# Patient Record
Sex: Male | Born: 1950 | Race: White | Hispanic: No | Marital: Married | Smoking: Never smoker
Health system: Southern US, Community
[De-identification: ages and names within clinical notes are randomized; demographics above are authoritative.]

## PROBLEM LIST (undated history)

## (undated) DIAGNOSIS — T300 Burn of unspecified body region, unspecified degree: Secondary | ICD-10-CM

## (undated) DIAGNOSIS — K759 Inflammatory liver disease, unspecified: Secondary | ICD-10-CM

## (undated) DIAGNOSIS — Y990 Civilian activity done for income or pay: Secondary | ICD-10-CM

## (undated) DIAGNOSIS — I8390 Asymptomatic varicose veins of unspecified lower extremity: Secondary | ICD-10-CM

## (undated) DIAGNOSIS — E785 Hyperlipidemia, unspecified: Secondary | ICD-10-CM

## (undated) DIAGNOSIS — E291 Testicular hypofunction: Secondary | ICD-10-CM

## (undated) DIAGNOSIS — E669 Obesity, unspecified: Secondary | ICD-10-CM

## (undated) HISTORY — PX: KNEE ARTHROSCOPY: SHX127

## (undated) HISTORY — DX: Burn of unspecified body region, unspecified degree: T30.0

## (undated) HISTORY — DX: Inflammatory liver disease, unspecified: K75.9

## (undated) HISTORY — DX: Civilian activity done for income or pay: Y99.0

## (undated) HISTORY — DX: Obesity, unspecified: E66.9

## (undated) HISTORY — DX: Hyperlipidemia, unspecified: E78.5

## (undated) HISTORY — DX: Asymptomatic varicose veins of unspecified lower extremity: I83.90

## (undated) HISTORY — DX: Testicular hypofunction: E29.1

---

## 1997-12-08 DIAGNOSIS — T300 Burn of unspecified body region, unspecified degree: Secondary | ICD-10-CM

## 1997-12-08 HISTORY — DX: Burn of unspecified body region, unspecified degree: T30.0

## 2007-03-31 ENCOUNTER — Encounter: Payer: Self-pay | Admitting: Family Medicine

## 2007-03-31 LAB — CONVERTED CEMR LAB
BUN: 13 mg/dL
Cholesterol: 155 mg/dL
HDL: 41 mg/dL
Hgb A1c MFr Bld: 7.5 %
LDL Cholesterol: 91 mg/dL

## 2007-12-16 ENCOUNTER — Ambulatory Visit: Payer: Self-pay | Admitting: Family Medicine

## 2007-12-16 DIAGNOSIS — E1165 Type 2 diabetes mellitus with hyperglycemia: Secondary | ICD-10-CM

## 2007-12-23 ENCOUNTER — Ambulatory Visit: Payer: Self-pay | Admitting: Family Medicine

## 2007-12-23 LAB — CONVERTED CEMR LAB

## 2008-01-10 ENCOUNTER — Encounter: Payer: Self-pay | Admitting: Family Medicine

## 2008-01-21 ENCOUNTER — Ambulatory Visit: Payer: Self-pay | Admitting: Family Medicine

## 2008-01-21 DIAGNOSIS — E291 Testicular hypofunction: Secondary | ICD-10-CM | POA: Insufficient documentation

## 2008-02-02 ENCOUNTER — Encounter: Payer: Self-pay | Admitting: Family Medicine

## 2008-02-02 LAB — CONVERTED CEMR LAB
Albumin: 4.4 g/dL (ref 3.5–5.2)
BUN: 16 mg/dL (ref 6–23)
CO2: 24 meq/L (ref 19–32)
Calcium: 9.2 mg/dL (ref 8.4–10.5)
Chloride: 104 meq/L (ref 96–112)
Cholesterol: 120 mg/dL (ref 0–200)
Creatinine, Ser: 0.85 mg/dL (ref 0.40–1.50)
Glucose, Bld: 124 mg/dL — ABNORMAL HIGH (ref 70–99)
HCT: 48.2 % (ref 39.0–52.0)
HDL: 37 mg/dL — ABNORMAL LOW (ref >39)
Hemoglobin: 15.6 g/dL (ref 13.0–17.0)
RBC: 5.26 M/uL (ref 4.22–5.81)
RDW: 13.2 % (ref 11.5–15.5)
Total CHOL/HDL Ratio: 3.2
WBC: 8.7 10*3/uL (ref 4.0–10.5)

## 2008-02-03 ENCOUNTER — Encounter: Payer: Self-pay | Admitting: Family Medicine

## 2008-03-07 ENCOUNTER — Telehealth: Payer: Self-pay | Admitting: Family Medicine

## 2008-04-19 ENCOUNTER — Encounter: Payer: Self-pay | Admitting: Family Medicine

## 2008-04-19 DIAGNOSIS — E7800 Pure hypercholesterolemia, unspecified: Secondary | ICD-10-CM

## 2008-04-19 DIAGNOSIS — F528 Other sexual dysfunction not due to a substance or known physiological condition: Secondary | ICD-10-CM

## 2008-05-15 ENCOUNTER — Ambulatory Visit: Payer: Self-pay | Admitting: Family Medicine

## 2008-05-15 LAB — CONVERTED CEMR LAB: Hgb A1c MFr Bld: 7.3 %

## 2008-09-06 ENCOUNTER — Ambulatory Visit: Payer: Self-pay | Admitting: Family Medicine

## 2008-09-11 LAB — CONVERTED CEMR LAB
ALT: 24 units/L (ref 0–53)
PSA, Free: 0.1 ng/mL
PSA: 0.42 ng/mL (ref 0.10–4.00)
Sex Hormone Binding: 31 nmol/L (ref 13–71)
Testosterone Free: 65.9 pg/mL (ref 47.0–244.0)
Testosterone: 320.83 ng/dL — ABNORMAL LOW (ref 350–890)

## 2008-09-19 ENCOUNTER — Encounter (INDEPENDENT_AMBULATORY_CARE_PROVIDER_SITE_OTHER): Payer: Self-pay | Admitting: Gastroenterology

## 2008-09-19 ENCOUNTER — Ambulatory Visit (HOSPITAL_COMMUNITY): Admission: RE | Admit: 2008-09-19 | Discharge: 2008-09-19 | Payer: Self-pay | Admitting: Gastroenterology

## 2009-03-13 ENCOUNTER — Encounter: Payer: Self-pay | Admitting: Family Medicine

## 2009-04-18 ENCOUNTER — Ambulatory Visit: Payer: Self-pay | Admitting: Family Medicine

## 2009-04-18 LAB — CONVERTED CEMR LAB
Albumin/Creatinine Ratio, Urine, POC: <30
BUN: 23 mg/dL (ref 6–23)
CO2: 23 meq/L (ref 19–32)
Calcium: 9.4 mg/dL (ref 8.4–10.5)
Chloride: 104 meq/L (ref 96–112)
Cholesterol: 148 mg/dL (ref 0–200)
Creatinine, Ser: 0.9 mg/dL (ref 0.40–1.50)
Glucose, Bld: 129 mg/dL — ABNORMAL HIGH (ref 70–99)
HCT: 49 % (ref 39.0–52.0)
HDL: 44 mg/dL (ref >39)
Hemoglobin: 16.4 g/dL (ref 13.0–17.0)
Hgb A1c MFr Bld: 7.1 %
MCV: 88.9 fL (ref 78.0–100.0)
Microalbumin U total vol: 30 mg/L
PSA, Free Pct: 18 — ABNORMAL LOW (ref >25)
Platelets: 161 10*3/uL (ref 150–400)
Testosterone-% Free: 1.4 % — ABNORMAL LOW (ref 1.6–2.9)
Testosterone: 148.43 ng/dL — ABNORMAL LOW (ref 350–890)
Total Bilirubin: 1.4 mg/dL — ABNORMAL HIGH (ref 0.3–1.2)
Total CHOL/HDL Ratio: 3.4
Triglycerides: 116 mg/dL (ref <150)
VLDL: 23 mg/dL (ref 0–40)
WBC: 7.4 10*3/uL (ref 4.0–10.5)

## 2009-04-20 ENCOUNTER — Encounter: Payer: Self-pay | Admitting: Family Medicine

## 2009-05-11 ENCOUNTER — Ambulatory Visit: Payer: Self-pay | Admitting: Family Medicine

## 2009-05-24 ENCOUNTER — Ambulatory Visit: Payer: Self-pay | Admitting: Family Medicine

## 2009-06-08 ENCOUNTER — Ambulatory Visit: Payer: Self-pay | Admitting: Family Medicine

## 2009-06-21 ENCOUNTER — Ambulatory Visit: Payer: Self-pay | Admitting: Family Medicine

## 2009-07-06 ENCOUNTER — Ambulatory Visit: Payer: Self-pay | Admitting: Family Medicine

## 2009-07-20 ENCOUNTER — Ambulatory Visit: Payer: Self-pay | Admitting: Family Medicine

## 2009-08-24 ENCOUNTER — Encounter: Payer: Self-pay | Admitting: Family Medicine

## 2009-08-27 ENCOUNTER — Ambulatory Visit: Payer: Self-pay | Admitting: Family Medicine

## 2009-08-27 LAB — CONVERTED CEMR LAB
Hgb A1c MFr Bld: 7.7 %
Sex Hormone Binding: 36 nmol/L (ref 13–71)

## 2009-09-03 ENCOUNTER — Telehealth (INDEPENDENT_AMBULATORY_CARE_PROVIDER_SITE_OTHER): Payer: Self-pay | Admitting: *Deleted

## 2009-10-29 ENCOUNTER — Telehealth: Payer: Self-pay | Admitting: Family Medicine

## 2009-11-06 ENCOUNTER — Encounter: Payer: Self-pay | Admitting: Family Medicine

## 2009-11-06 LAB — CONVERTED CEMR LAB
ALT: 18 units/L (ref 0–53)
Albumin: 4.3 g/dL (ref 3.5–5.2)
HCT: 46.6 % (ref 39.0–52.0)
Indirect Bilirubin: 1.9 mg/dL — ABNORMAL HIGH (ref 0.0–0.9)
Sex Hormone Binding: 37 nmol/L (ref 13–71)
Testosterone Free: 138.1 pg/mL (ref 47.0–244.0)
Testosterone-% Free: 2.1 % (ref 1.6–2.9)
Testosterone: 669.76 ng/dL (ref 350–890)
Total Protein: 6.7 g/dL (ref 6.0–8.3)

## 2009-11-08 ENCOUNTER — Ambulatory Visit: Payer: Self-pay | Admitting: Family Medicine

## 2009-12-03 ENCOUNTER — Ambulatory Visit: Payer: Self-pay | Admitting: Family Medicine

## 2009-12-03 DIAGNOSIS — J209 Acute bronchitis, unspecified: Secondary | ICD-10-CM | POA: Insufficient documentation

## 2009-12-13 ENCOUNTER — Encounter: Payer: Self-pay | Admitting: Family Medicine

## 2009-12-13 ENCOUNTER — Telehealth: Payer: Self-pay | Admitting: Family Medicine

## 2009-12-20 ENCOUNTER — Telehealth (INDEPENDENT_AMBULATORY_CARE_PROVIDER_SITE_OTHER): Payer: Self-pay | Admitting: *Deleted

## 2010-01-22 ENCOUNTER — Ambulatory Visit: Payer: Self-pay | Admitting: Family Medicine

## 2010-01-22 DIAGNOSIS — R748 Abnormal levels of other serum enzymes: Secondary | ICD-10-CM | POA: Insufficient documentation

## 2010-01-23 LAB — CONVERTED CEMR LAB
ALT: 25 units/L (ref 0–53)
Bilirubin, Direct: 0.4 mg/dL — ABNORMAL HIGH (ref 0.0–0.3)
Sex Hormone Binding: 45 nmol/L (ref 13–71)
Testosterone: 565.8 ng/dL (ref 350–890)
Total Bilirubin: 2 mg/dL — ABNORMAL HIGH (ref 0.3–1.2)

## 2010-02-01 ENCOUNTER — Encounter: Payer: Self-pay | Admitting: Family Medicine

## 2010-02-05 ENCOUNTER — Encounter: Payer: Self-pay | Admitting: Family Medicine

## 2010-02-06 ENCOUNTER — Telehealth: Payer: Self-pay | Admitting: Family Medicine

## 2010-04-30 ENCOUNTER — Ambulatory Visit: Payer: Self-pay | Admitting: Family Medicine

## 2010-04-30 DIAGNOSIS — S41109A Unspecified open wound of unspecified upper arm, initial encounter: Secondary | ICD-10-CM | POA: Insufficient documentation

## 2010-04-30 DIAGNOSIS — S060XAA Concussion with loss of consciousness status unknown, initial encounter: Secondary | ICD-10-CM | POA: Insufficient documentation

## 2010-04-30 DIAGNOSIS — S060X9A Concussion with loss of consciousness of unspecified duration, initial encounter: Secondary | ICD-10-CM

## 2010-04-30 DIAGNOSIS — S20219A Contusion of unspecified front wall of thorax, initial encounter: Secondary | ICD-10-CM

## 2010-04-30 DIAGNOSIS — M5412 Radiculopathy, cervical region: Secondary | ICD-10-CM

## 2010-05-01 LAB — CONVERTED CEMR LAB
Basophils Absolute: 0 10*3/uL (ref 0.0–0.1)
Basophils Relative: 0 % (ref 0–1)
Eosinophils Absolute: 0.2 10*3/uL (ref 0.0–0.7)
Eosinophils Relative: 2 % (ref 0–5)
HCT: 47.1 % (ref 39.0–52.0)
Lymphocytes Relative: 23 % (ref 12–46)
Monocytes Absolute: 0.9 10*3/uL (ref 0.1–1.0)
Neutrophils Relative %: 66 % (ref 43–77)
Platelets: 193 10*3/uL (ref 150–400)
RBC: 5.24 M/uL (ref 4.22–5.81)
WBC: 10.2 10*3/uL (ref 4.0–10.5)

## 2010-05-14 ENCOUNTER — Ambulatory Visit: Payer: Self-pay | Admitting: Family Medicine

## 2010-05-14 LAB — CONVERTED CEMR LAB: Hgb A1c MFr Bld: 7.4 %

## 2010-05-22 ENCOUNTER — Encounter: Admission: RE | Admit: 2010-05-22 | Discharge: 2010-06-04 | Payer: Self-pay | Admitting: Family Medicine

## 2010-05-23 ENCOUNTER — Encounter: Payer: Self-pay | Admitting: Family Medicine

## 2010-05-27 ENCOUNTER — Encounter: Payer: Self-pay | Admitting: Family Medicine

## 2010-06-05 ENCOUNTER — Encounter: Payer: Self-pay | Admitting: Family Medicine

## 2010-06-12 ENCOUNTER — Ambulatory Visit: Payer: Self-pay | Admitting: Family Medicine

## 2010-06-14 LAB — CONVERTED CEMR LAB
Albumin: 4.4 g/dL (ref 3.5–5.2)
BUN: 14 mg/dL (ref 6–23)
CO2: 22 meq/L (ref 19–32)
Calcium: 9 mg/dL (ref 8.4–10.5)
Chloride: 103 meq/L (ref 96–112)
Cholesterol: 128 mg/dL (ref 0–200)
Creatinine, Ser: 0.9 mg/dL (ref 0.40–1.50)
Glucose, Bld: 184 mg/dL — ABNORMAL HIGH (ref 70–99)
HCT: 48.3 % (ref 39.0–52.0)
HDL: 36 mg/dL — ABNORMAL LOW (ref >39)
Hemoglobin: 16.2 g/dL (ref 13.0–17.0)
Potassium: 4.5 meq/L (ref 3.5–5.3)
RBC: 5.29 M/uL (ref 4.22–5.81)
Testosterone-% Free: 2.7 % (ref 1.6–2.9)
Total CHOL/HDL Ratio: 3.6
WBC: 8.5 10*3/uL (ref 4.0–10.5)

## 2010-09-18 ENCOUNTER — Ambulatory Visit: Payer: Self-pay | Admitting: Family Medicine

## 2010-09-18 DIAGNOSIS — N289 Disorder of kidney and ureter, unspecified: Secondary | ICD-10-CM | POA: Insufficient documentation

## 2010-09-18 LAB — CONVERTED CEMR LAB
Albumin/Creatinine Ratio, Urine, POC: <30
Creatinine,U: 200 mg/dL

## 2010-09-25 ENCOUNTER — Encounter: Admission: RE | Admit: 2010-09-25 | Discharge: 2010-09-25 | Payer: Self-pay | Admitting: Family Medicine

## 2010-10-28 ENCOUNTER — Encounter: Payer: Self-pay | Admitting: Family Medicine

## 2010-11-05 LAB — CONVERTED CEMR LAB: Testosterone: 193.43 ng/dL — ABNORMAL LOW (ref 250–890)

## 2010-12-10 ENCOUNTER — Telehealth (INDEPENDENT_AMBULATORY_CARE_PROVIDER_SITE_OTHER): Payer: Self-pay | Admitting: *Deleted

## 2011-01-01 ENCOUNTER — Encounter: Payer: Self-pay | Admitting: Family Medicine

## 2011-01-01 ENCOUNTER — Ambulatory Visit
Admission: RE | Admit: 2011-01-01 | Discharge: 2011-01-01 | Payer: Self-pay | Source: Home / Self Care | Attending: Family Medicine | Admitting: Family Medicine

## 2011-01-01 DIAGNOSIS — N281 Cyst of kidney, acquired: Secondary | ICD-10-CM | POA: Insufficient documentation

## 2011-01-01 DIAGNOSIS — R809 Proteinuria, unspecified: Secondary | ICD-10-CM | POA: Insufficient documentation

## 2011-01-01 LAB — CONVERTED CEMR LAB
Creatinine,U: 200 mg/dL
Microalbumin U total vol: 80 mg/L

## 2011-01-02 DIAGNOSIS — R17 Unspecified jaundice: Secondary | ICD-10-CM | POA: Insufficient documentation

## 2011-01-02 LAB — CONVERTED CEMR LAB
AST: 29 units/L (ref 0–37)
Albumin: 4.6 g/dL (ref 3.5–5.2)
Alkaline Phosphatase: 52 units/L (ref 39–117)
BUN: 12 mg/dL (ref 6–23)
Glucose, Bld: 151 mg/dL — ABNORMAL HIGH (ref 70–99)
HDL: 41 mg/dL (ref >39)
Hemoglobin: 16.6 g/dL (ref 13.0–17.0)
LDL Cholesterol: 67 mg/dL (ref 0–99)
MCHC: 33.2 g/dL (ref 30.0–36.0)
MCV: 93.5 fL (ref 78.0–100.0)
Potassium: 4.2 meq/L (ref 3.5–5.3)
RBC: 5.35 M/uL (ref 4.22–5.81)
RDW: 12.2 % (ref 11.5–15.5)
Testosterone: 689.56 ng/dL (ref 250–890)
Total Bilirubin: 2.1 mg/dL — ABNORMAL HIGH (ref 0.3–1.2)
Total CHOL/HDL Ratio: 3.4
Triglycerides: 151 mg/dL — ABNORMAL HIGH (ref <150)
VLDL: 30 mg/dL (ref 0–40)

## 2011-01-07 NOTE — Letter (Signed)
Summary: Generic Letter  [facility]  [address], [address]   [place], [place] [address]   Phone: [phone]  Fax: [phone]    05/14/2010  Jordan Marquez [address] [place], [place]  [address]  Dear [name],  This letter is to verify that you are medically cleared to return to work on June 20th, 2011.       Sincerely,     Seymour Bars DO

## 2011-01-07 NOTE — Letter (Signed)
Summary: Generic Letter  [facility]  [address], [address]   [place], [place] [address]   Phone: [phone]  Fax: [phone]    04/30/2010  [name] [address] [place], [place]  [address]  To Whom It May Concern,  Mr. Jordan Marquez will be out of work from present time through June 20th, 2011 for work - induced injuries.        Sincerely,    Seymour Bars DO

## 2011-01-07 NOTE — Progress Notes (Signed)
Summary: Claim denied  Phone Note Call from Patient Call back at Work Phone [phone]   Caller: Patient Reason for Call: Insurance Question Details for Reason: claim denial for lab test Summary of Call: Patient called requesting that office visit notes be sent to [facility]/Insurance due to denial of previous labs ordered to check testosterone level.  Patient stated that he was not taking testosterone due to sexual dys. and insurance denied based on that reason.  I contacted [employer] and they are re-sending the claim with an additional code (V58.83), also requested that Payton Spark fax corr office note to [facility].  Patient advised. Fabienne Bruns Initial call taken by: Fabienne Bruns  Follow-up for Phone Call        Office note faxed to [facility] @ [phone] Follow-up by: Payton Spark CMA,  December 13, 2009 10:23 AM

## 2011-01-07 NOTE — Assessment & Plan Note (Signed)
Summary: f/u DM/ liver    Vital Signs:  Patient profile:   60 year old male Height:      66.75 inches Weight:      237 pounds BMI:     37.53 O2 Sat:      97 % on Room air Temp:     97.9 degrees F oral Pulse rate:   60 / minute BP sitting:   121 / 71  (right arm) Cuff size:   regular  Vitals Entered BySelena Batten Johnson/April (January 22, 2010 8:18 AM)  O2 Flow:  Room air CC: F/U   Primary Care Provider:  Seymour Bars DO  CC:  F/U.  History of Present Illness: 60 yo WM presents for presents for f/u T2DM.  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  He is trying to adjust his diet and medication times.  He is on Metformin 1 gram two times a day and 15 units of Levemir at bedtime.  His AM fastings are in the low 100s -110.  He is more active at his new job and he has fair dietary compliance.  He has failed to lose any wt.  Denies any true lows but feels low in the 80s - 90s.    His urine microalbumin is UTD.  Diabetic eye exam is UTD. He is due for recheck of his bilirubin, on a lower dose of compounded testosterone now.  Denies CP or DOE, polyuria, blurry vision or paresthesias.    Current Medications (verified): 1)  Simvastatin 40 Mg  Tabs (Simvastatin) .... Take One Tanlet By Mouth Once A Day 2)  Metformin Hcl 1000 Mg Tabs (Metformin Hcl) .Marland Kitchen.. 1 Tab By Mouth Two Times A Day 3)  Novofine 30g X 8 Mm  Misc (Insulin Pen Needle) .... Use As Directed Daily 4)  Levemir Flexpen 100 Unit/ml  Soln (Insulin Detemir) .Marland Kitchen.. 15 Units [place] Qhs 5)  Viagra 100 Mg  Tabs (Sildenafil Citrate) .... 1/2 To 1 Tab By Mouth X 1 As Needed 6)  One Touch Test Strips and Lancets .... Use Twice Daily As Directed Dx:250.00 7)  Aspirin Adult Low Strength 81 Mg Tbec (Aspirin) .... Take 1 Tablet By Mouth Once A Day 8)  Compounded Topical Testosterone 25% Cream .... 10 Ml  1/2 Cc Two Times A Day 9)  Azithromycin 250 Mg Tabs (Azithromycin) .... Two Tabs By Mouth On Day 1, Then 1 Tab  Daily On Days 2 Through 5 10)  Benzonatate 200 Mg Caps (Benzonatate) .... One By Mouth Hs As Needed Cough  Allergies (verified): No Known Drug Allergies  Past History:  Past Medical History: DM 1-09 High cholestrol hypogonadism obesity  Social History: Reviewed history from 12/16/2007 and no changes required. 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 Has a daughter and 2 grandkids, loca. Never smoked. Denies ETOH. Walks 30 min a day. Poor diet.  Review of Systems      See HPI  Physical Exam  General:  alert, well-developed, well-nourished, and well-hydrated.  truncal obesity Head:  normocephalic, atraumatic, and male-pattern balding.   Eyes:  sclera on R injected.  No watering, lid edema or discharge.  PERRLA Nose:  no nasal discharge.   Mouth:  pharynx pink and moist.   Neck:  no masses.   Lungs:  Normal respiratory effort, chest expands symmetrically. Lungs are clear to auscultation, no crackles or wheezes. Heart:  Normal rate and regular rhythm. S1 and S2 normal without gallop,  murmur, click, rub or other extra sounds. Extremities:  no LE edema clubbing of L index fingernail Skin:  color normal.   Psych:  good eye contact, not anxious appearing, and not depressed appearing.    Diabetes Management Exam:    Foot Exam (with socks and/or shoes not present):       Sensory-Monofilament:          Left foot: normal          Right foot: normal       Inspection:          Left foot: normal          Right foot: normal       Nails:          Left foot: thickened          Right foot: thickened   Impression & Recommendations:  Problem # 1:  DIABETES MELLITUS, TYPE II (ICD-250.00) A1C improved from 7.7--> 7.1.  Continue Metformin and Levemir.  AM fastings at goal.  Needs to work on diet, exercise, wt loss.  Umicroalbumin is UTD.  Diabetic eye exam is UTD.  F/U in 4 mos. His updated medication list for this problem includes:    Metformin Hcl 1000 Mg Tabs (Metformin hcl) .Marland Kitchen...  1 tab by mouth two times a day    Levemir Flexpen 100 Unit/ml Soln (Insulin detemir) .Marland KitchenMarland KitchenMarland KitchenMarland Kitchen 15 units [place] qhs    Aspirin Adult Low Strength 81 Mg Tbec (Aspirin) .Marland Kitchen... Take 1 tablet by mouth once a day  Orders: Fingerstick ([address]) Hemoglobin A1C ([address])  Labs Reviewed: Creat: 0.90 (04/18/2009)   Microalbumin: 30 (04/18/2009)  Last Eye Exam: no retinopathy; Dr Tiburcio Pea at [facility] (02/27/2009) Reviewed HgBA1c results: 7.7 (08/27/2009)  7.1 (04/18/2009)  Problem # 2:  HYPOGONADISM (ICD-257.2) On compounded testosterone.  PSA and Hct are UTD.  Check prostate at next CPE. Update labs for mildly high bilirubin and recheck testosterone level today. Orders: T-Testosterone, Free and Total [phone])  Problem # 3:  HYPERCHOLESTEROLEMIA (ICD-272.0)  His updated medication list for this problem includes:    Simvastatin 40 Mg Tabs (Simvastatin) .Marland Kitchen... Take one tanlet by mouth once a day  Labs Reviewed: SGOT: 25 (11/06/2009)   SGPT: 18 (11/06/2009)   HDL:44 (04/18/2009), 37 (02/02/2008)  LDL:81 (04/18/2009), 60 (02/02/2008)  Chol:148 (04/18/2009), 120 (02/02/2008)  Trig:116 (04/18/2009), 113 (02/02/2008)  Problem # 4:  OTHER NONSPECIFIC ABNORMAL SERUM ENZYME LEVELS (ICD-790.5) Recheck labs today.  He is off APAP and ETOH and on a lower dose of testoterone.  Remains asymptomatic.   Orders: T-Bilirubin, Total [id number]) T-Bilirubin, Direct ([id number]) T-ALT/SGPT ([id number]) T-AST/SGOT ([id number])  Complete Medication List: 1)  Simvastatin 40 Mg Tabs (Simvastatin) .... Take one tanlet by mouth once a day 2)  Metformin Hcl 1000 Mg Tabs (Metformin hcl) .Marland Kitchen.. 1 tab by mouth two times a day 3)  Novofine 30g X 8 Mm Misc (Insulin pen needle) .... Use as directed daily 4)  Levemir Flexpen 100 Unit/ml Soln (Insulin detemir) .Marland Kitchen.. 15 units [place] qhs 5)  Viagra 100 Mg Tabs (Sildenafil citrate) .... 1/2 to 1 tab by mouth x 1 as needed 6)  One Touch Test Strips and Lancets  .... Use twice  daily as directed dx:250.00 7)  Aspirin Adult Low Strength 81 Mg Tbec (Aspirin) .... Take 1 tablet by mouth once a day 8)  Compounded Topical Testosterone 25% Cream  .... 10 ml  1/2 cc two times a day  Patient Instructions: 1)  Labs today. 2)  May  need a liver u/s if still elevated. 3)  Keep working on diabetic diet, regular exercise and wt loss. 4)  A1C good at 7.1. 5)  Stay on current meds. 6)  Return for f/u diabetes in 4 mos.  Laboratory Results   Blood Tests   Date/Time Received: 01/22/2010 Date/Time Reported: 8:19 AM        Appended Document: f/u DM/ liver      Allergies: No Known Drug Allergies   Complete Medication List: 1)  Simvastatin 40 Mg Tabs (Simvastatin) .... Take one tanlet by mouth once a day 2)  Metformin Hcl 1000 Mg Tabs (Metformin hcl) .Marland Kitchen.. 1 tab by mouth two times a day 3)  Novofine 30g X 8 Mm Misc (Insulin pen needle) .... Use as directed daily 4)  Levemir Flexpen 100 Unit/ml Soln (Insulin detemir) .Marland Kitchen.. 15 units [place] qhs 5)  Viagra 100 Mg Tabs (Sildenafil citrate) .... 1/2 to 1 tab by mouth x 1 as needed 6)  One Touch Test Strips and Lancets  .... Use twice daily as directed dx:250.00 7)  Aspirin Adult Low Strength 81 Mg Tbec (Aspirin) .... Take 1 tablet by mouth once a day 8)  Compounded Topical Testosterone 25% Cream  .... 10 ml  1/2 cc two times a day  Other Orders: Fingerstick ([address]) Hemoglobin A1C ([address])   Laboratory Results   Blood Tests     HGBA1C: 7.1%   (Normal Range: Non-Diabetic - 3-6%   Control Diabetic - 6-8%)

## 2011-01-07 NOTE — Assessment & Plan Note (Signed)
Summary: neck pain    Vital Signs:  Patient profile:   60 year old male Height:      66.75 inches Weight:      230.75 pounds BMI:     36.54 Temp:     97.1 degrees F oral Pulse rate:   64 / minute Pulse rhythm:   regular Resp:     16 per minute BP sitting:   119 / 77  (right arm) Cuff size:   large  Vitals Entered By: Mervin Kung CMA Duncan Dull) (June 12, 2010 8:17 AM) CC: Room 2  1 month follow up. Is Patient Diabetic? Yes   Primary Care Provider:  Seymour Bars DO  CC:  Room 2  1 month follow up.Marland Kitchen  History of Present Illness: 60 yo WM  presents for 1 mo f/u neck pain.  After his last visit, he started PT down the hall which really helped at the beginning.  He then ran into a problem with his workers comp and his PT was moved to another location in [place]  His last session was about 9 days ago and now his neck pain is coming back.  He is having more pain sleeping in his bed now down the R side of his neck and thoracic region.  He is using heat and massage chair but it is not helping.   He is using Flexeril and RX ibuprofen which helps some.  He is back down to 18 units of Levemir once a day and now his AM fastings are in the 130s.   Allergies (verified): No Known Drug Allergies  Past History:  Past Medical History: Reviewed history from 04/30/2010 and no changes required. DM 1-09 High cholestrol hypogonadism obesity  work accident 04-2010  Social History: Reviewed history from 12/16/2007 and no changes required. 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 Has a daughter and 2 grandkids, loca. Never smoked. Denies ETOH. Walks 30 min a day. Poor diet.  Review of Systems      See HPI  Physical Exam  General:  alert, well-developed, well-nourished, well-hydrated, and overweight-appearing.   Head:  normocephalic and atraumatic.   Neck:  limited in full SB and rotation to the L with SCM and trap tightness on the L>R Chest Wall:  no tenderness.   Lungs:  Normal  respiratory effort, chest expands symmetrically. Lungs are clear to auscultation, no crackles or wheezes. Heart:  Normal rate and regular rhythm. S1 and S2 normal without gallop, murmur, click, rub or other extra sounds. Skin:  color normal.  no bruising Psych:  good eye contact, not anxious appearing, and not depressed appearing.     Impression & Recommendations:  Problem # 1:  NECK PAIN (ICD-723.1) Started to improve with PT but did not complete it due to worker's comp issues.  He plans to complete a couple more wks of group PT along with continuing Skelexin, heat, stretching, icy hot and will wean down off NSAIDs.   His updated medication list for this problem includes:    Aspirin Adult Low Strength 81 Mg Tbec (Aspirin) .Marland Kitchen... Take 1 tablet by mouth once a day    Skelaxin 800 Mg Tabs (Metaxalone) .Marland Kitchen... 1 tab by mouth three times a day as needed neck pain (take without food)    Ibuprofen 800 Mg Tabs (Ibuprofen) .Marland Kitchen... 1 tab by mouth three times a day with food x 10 days. as needed.  Complete Medication List: 1)  Simvastatin 40 Mg Tabs (Simvastatin) .... Take one tanlet by mouth  once a day 2)  Metformin Hcl 1000 Mg Tabs (Metformin hcl) .Marland Kitchen.. 1 tab by mouth two times a day 3)  Novofine 30g X 8 Mm Misc (Insulin pen needle) .... Use as directed daily 4)  Levemir Flexpen 100 Unit/ml Soln (Insulin detemir) .... 20 units [place] at bedtime 5)  Viagra 100 Mg Tabs (Sildenafil citrate) .... 1/2 to 1 tab by mouth x 1 as needed 6)  Aspirin Adult Low Strength 81 Mg Tbec (Aspirin) .... Take 1 tablet by mouth once a day 7)  Compounded Topical Testosterone 25% Cream  .... 10 ml  1/2 cc two times a day 8)  Truetrack Test Strp (Glucose blood) .... Use as directed to check blood sugar six times daily 9)  True Track Glucometer  .... Use as directed dx: 250.00 10)  Skelaxin 800 Mg Tabs (Metaxalone) .Marland Kitchen.. 1 tab by mouth three times a day as needed neck pain (take without food) 11)  Ibuprofen 800 Mg Tabs (Ibuprofen)  .Marland Kitchen.. 1 tab by mouth three times a day with food x 10 days. as needed.  Other Orders: T-Comprehensive Metabolic Panel [id number]) T-Lipid Profile [id number]) T-CBC No Diff ([id number]) T-PSA ([id number]) T-Testosterone, Free and Total [id number])  Patient Instructions: 1)  Complete physical therapy. 2)  Continue home stretches, heat, icy hot. 3)  Update fasting labs today. 4)  Willl call you w/ result tomorrow. 5)  REturn for f/u diabetes in 2 mos. Prescriptions: SKELAXIN 800 MG TABS (METAXALONE) 1 tab by mouth three times a day as needed neck pain (take without food)  #60 x 0   Entered and Authorized by:   Seymour Bars DO   Signed by:   Seymour Bars DO on 06/12/2010   Method used:   Electronically to        [facility] [phone]* (retail)       [address]       [place], [place]  [address]       Ph: [phone] or [phone]       Fax: [phone]   RxID:   [id number]   Current Allergies (reviewed today): No known allergies

## 2011-01-07 NOTE — Miscellaneous (Signed)
Summary: PT Initial Summary/[facility]  PT Initial [facility]   Imported By: Lanelle Bal 06/04/2010 12:15:31  _____________________________________________________________________  External Attachment:    Type:   Image     Comment:   External Document

## 2011-01-07 NOTE — Letter (Signed)
Summary: [facility]  [facility]   Imported By: Lanelle Bal 02/12/2010 08:11:38  _____________________________________________________________________  External Attachment:    Type:   Image     Comment:   External Document

## 2011-01-07 NOTE — Miscellaneous (Signed)
Summary: PT Initial Summary/[facility]  PT Initial [facility]   Imported By: Lanelle Bal 05/30/2010 14:33:48  _____________________________________________________________________  External Attachment:    Type:   Image     Comment:   External Document

## 2011-01-07 NOTE — Assessment & Plan Note (Signed)
Summary: f/u from accident   Vital Signs:  Patient profile:   60 year old male Height:      66.75 inches Weight:      232 pounds Pulse rate:   74 / minute BP sitting:   112 / 70  (left arm) Cuff size:   large  Vitals Entered By: Kathlene November (May 14, 2010 8:05 AM) CC: f/u accident. Pt states doing better ear is better, still has some weakness in his right hand and pain in neck and shoulders.   Primary Care Provider:  Seymour Bars DO  CC:  f/u accident. Pt states doing better ear is better and still has some weakness in his right hand and pain in neck and shoulders..  History of Present Illness: 60 yo WM presents for f/u of work accident.  He is doing better but continues to have some swelling in the R hand.  He has pain in the neck and shoulders that keeps him up at night.  The wound over his distal R forearm has much improved.  He has full ROM of the R wrist and hand with minimal weakness and no numbness or pain.  He stopped Flexeril b/c it was making him drousy into the day.  As far as his post concussive symptoms, this has improved.  He is no longer having nausea, HAs, lightheadedness  or confusion.  Most of his pain is over T1 midline with tightenss of the muscles around the neck and upper thoracic spine.    Current Medications (verified): 1)  Simvastatin 40 Mg  Tabs (Simvastatin) .... Take One Tanlet By Mouth Once A Day 2)  Metformin Hcl 1000 Mg Tabs (Metformin Hcl) .Marland Kitchen.. 1 Tab By Mouth Two Times A Day 3)  Novofine 30g X 8 Mm  Misc (Insulin Pen Needle) .... Use As Directed Daily 4)  Levemir Flexpen 100 Unit/ml  Soln (Insulin Detemir) .... 20 Units [place] Qhs 5)  Viagra 100 Mg  Tabs (Sildenafil Citrate) .... 1/2 To 1 Tab By Mouth X 1 As Needed 6)  Aspirin Adult Low Strength 81 Mg Tbec (Aspirin) .... Take 1 Tablet By Mouth Once A Day 7)  Compounded Topical Testosterone 25% Cream .... 10 Ml  1/2 Cc Two Times A Day 8)  Truetrack Test  Strp (Glucose Blood) .... Use As Directed To Check  Blood Sugar Six Times Daily 9)  True Track Glucometer .... Use As Directed Dx: 250.00 10)  Flexeril 5 Mg Tabs (Cyclobenzaprine Hcl) .Marland Kitchen.. 1-2 Tabs By Mouth Q 8 Hrs As Needed Pain 11)  Vicodin 5-500 Mg Tabs (Hydrocodone-Acetaminophen) .Marland Kitchen.. 1 Tab By Mouth Two Times A Day As Needed Severe Pain 12)  Meloxicam 7.5 Mg Tabs (Meloxicam) .Marland Kitchen.. 1-2 Tabs By Mouth Once A Day With Food For Pain 13)  Zofran 8 Mg Tabs (Ondansetron Hcl) .Marland Kitchen.. 1 Tab By Mouth Q 8 Hrs As Needed Nausea  Allergies (verified): No Known Drug Allergies  Comments:  Nurse/Medical Assistant: The patient's medications and allergies were reviewed with the patient and were updated in the Medication and Allergy Lists. Kathlene November (May 14, 2010 8:06 AM)  Past History:  Past Medical History: Reviewed history from 04/30/2010 and no changes required. DM 1-09 High cholestrol hypogonadism obesity  work accident 04-2010  Social History: Reviewed history from 12/16/2007 and no changes required. 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 Has a daughter and 2 grandkids, loca. Never smoked. Denies ETOH. Walks 30 min a day. Poor diet.  Review of Systems  See HPI  Physical Exam  General:  alert, well-developed, well-nourished, and well-hydrated.  obese in NAD Head:  normocephalic, atraumatic, and male-pattern balding.   Eyes:  conjunctiva is mildly injected bilat Nose:  no nasal discharge.   Mouth:  good dentition and pharynx pink and moist.   Neck:  no masses.   Chest Wall:  much improvement in chest wall bruising.  no splinting or tenderness Msk:  full C spine active ROM point tender over spinous process of T1-T2 with trapezious tightness Pulses:  2+ R radial and ulnar pulse Extremities:  no LE edema. 1+ soft tissue edema over R hand   Impression & Recommendations:  Problem # 1:  NECK PAIN (ICD-723.1) Still having trap tightness and pain over upper thoracic spine.  Will change Flexeril to Skelaxin -- less sedating and  change Meloxicam to RX Iburpofen for the next 10 days.  Set up for PT to work on improving pain.  RTC in 6 wks. The following medications were removed from the medication list:    Flexeril 5 Mg Tabs (Cyclobenzaprine hcl) .Marland Kitchen... 1-2 tabs by mouth q 8 hrs as needed pain    Vicodin 5-500 Mg Tabs (Hydrocodone-acetaminophen) .Marland Kitchen... 1 tab by mouth two times a day as needed severe pain    Meloxicam 7.5 Mg Tabs (Meloxicam) .Marland Kitchen... 1-2 tabs by mouth once a day with food for pain His updated medication list for this problem includes:    Aspirin Adult Low Strength 81 Mg Tbec (Aspirin) .Marland Kitchen... Take 1 tablet by mouth once a day    Skelaxin 800 Mg Tabs (Metaxalone) .Marland Kitchen... 1 tab by mouth three times a day as needed neck pain (take without food)    Ibuprofen 800 Mg Tabs (Ibuprofen) .Marland Kitchen... 1 tab by mouth three times a day with food x 10 days  Orders: Physical Therapy Referral (PT)  Problem # 2:  CONCUSSION (ICD-850.9) Assessment: Improved Much improved.  No longer symptomatic.  Problem # 3:  CONTUSION, CHEST WALL (ICD-922.1) Assessment: Improved Much improved.  Problem # 4:  DIABETES MELLITUS, TYPE II (ICD-250.00) A1C up to 7.4 from 7.1.  Sugars did run higher from stress of the accident which is expected.  Will continue Metformin at current dose and go up by 5 units on Levemir daily.  RTC in 6 wks for f/u. His updated medication list for this problem includes:    Metformin Hcl 1000 Mg Tabs (Metformin hcl) .Marland Kitchen... 1 tab by mouth two times a day    Levemir Flexpen 100 Unit/ml Soln (Insulin detemir) .Marland Kitchen... 25 units [place] at bedtime    Aspirin Adult Low Strength 81 Mg Tbec (Aspirin) .Marland Kitchen... Take 1 tablet by mouth once a day  Orders: Fingerstick ([address]) Hemoglobin A1C ([address])  Labs Reviewed: Creat: 0.90 (04/18/2009)   Microalbumin: 30 (04/18/2009)  Last Eye Exam: no retinopathy; Dr Tiburcio [facility]a at [facility] (02/27/2009) Reviewed HgBA1c results: 7.4 (05/14/2010)  7.1 (01/22/2010)  Problem # 5:  WOUND, ARM  (ICD-884.0) Improved with residual R hand soft tissue edema- mild.  Expect improvement over the next 2 wks.  Complete Medication List: 1)  Simvastatin 40 Mg Tabs (Simvastatin) .... Take one tanlet by mouth once a day 2)  Metformin Hcl 1000 Mg Tabs (Metformin hcl) .Marland Kitchen.. 1 tab by mouth two times a day 3)  Novofine 30g X 8 Mm Misc (Insulin pen needle) .... Use as directed daily 4)  Levemir Flexpen 100 Unit/ml Soln (Insulin detemir) .... 25 units [place] at bedtime 5)  Viagra 100 Mg Tabs (Sildenafil  citrate) .... 1/2 to 1 tab by mouth x 1 as needed 6)  Aspirin Adult Low Strength 81 Mg Tbec (Aspirin) .... Take 1 tablet by mouth once a day 7)  Compounded Topical Testosterone 25% Cream  .... 10 ml  1/2 cc two times a day 8)  Truetrack Test Strp (Glucose blood) .... Use as directed to check blood sugar six times daily 9)  True Track Glucometer  .... Use as directed dx: 250.00 10)  Skelaxin 800 Mg Tabs (Metaxalone) .Marland Kitchen.. 1 tab by mouth three times a day as needed neck pain (take without food) 11)  Ibuprofen 800 Mg Tabs (Ibuprofen) .Marland Kitchen.. 1 tab by mouth three times a day with food x 10 days  Patient Instructions: 1)  A1C 7.4 = OK.  Will increase your Levemir to 25 units once a day. 2)  Will set up PT down the hall. 3)  Change flexeril to skelaxin (muscle relaxer). 4)  Change Meloxicam or Aleve to RX Ibuprofen 800 mg (hold aspirin while on this). 5)  REturn for f/u neck pain in 1 month. Prescriptions: COMPOUNDED TOPICAL TESTOSTERONE 25% CREAM 10 ml  1/2 cc two times a day  #51ml x 0   Entered and Authorized by:   Seymour Bars DO   Signed by:   Seymour Bars DO on 05/14/2010   Method used:   Printed then faxed to ...       [facility]* (retail)       [address]       [address] Shipping/mailing       [place], [place]  [address]       Ph: [phone]       Fax: [phone]   RxID:   [id number] IBUPROFEN 800 MG TABS (IBUPROFEN) 1 tab by mouth three times a day with food x 10  days  #30 x 0   Entered and Authorized by:   Seymour Bars DO   Signed by:   Seymour Bars DO on 05/14/2010   Method used:   Electronically to        [facility]* (retail)       [address]       [address] Shipping/mailing       [place], [place]  [address]       Ph: [phone]       Fax: [phone]   RxID:   [id number] SKELAXIN 800 MG TABS (METAXALONE) 1 tab by mouth three times a day as needed neck pain (take without food)  #40 x 0   Entered and Authorized by:   Seymour Bars DO   Signed by:   Seymour Bars DO on 05/14/2010   Method used:   Electronically to        [facility]* (retail)       [address]       [address] Shipping/mail[address]       [place], [place]  [address]       Ph: [phone]       Fax: [phone]   RxID:   [id number]   Laboratory Results   Blood Tests   Date/Time Received: 05/14/2010 Date/Time Reported: 05/14/2010  HGBA1C: 7.4%   (Normal Range: Non-Diabetic - 3-6%   Control Diabetic - 6-8%)

## 2011-01-07 NOTE — Progress Notes (Signed)
Summary: error

## 2011-01-07 NOTE — Assessment & Plan Note (Signed)
Summary: F/u from a work accident, remove sutures, remove neck brace- jr   Vital Signs:  Patient profile:   60 year old male Height:      66.75 inches Weight:      229 pounds Pulse rate:   92 / minute BP sitting:   113 / 65  (left arm) Cuff size:   regular  Vitals Entered By: Kathlene November (Apr 30, 2010 1:10 PM) CC: accident at work last Tuesday- got caught up in a machine- tore right ear off, laceration to right wrist, concussion. in alot of pain and nausea off and on   Primary Care Provider:  Seymour Bars DO  CC:  accident at work last Tuesday- got caught up in a machine- tore right ear off, laceration to right wrist, and concussion. in alot of pain and nausea off and on.  History of Present Illness: 60 yo WM presents after a work accident last Tuesday.  A machine ran into him.  He had a concussion.  He had a CT scan of the head and neck that were negative.  He had normal CXRs.  He had sutures in the R arm.  He had sutures in the R ear.  He is back on his meds.  His sugars are running in the 200s.  His sutures were removed from the R wrist yesterday but it still hurts and feels weak.  He has jaw pain from getting pinned between the tank and the machine.    He denies confusion but has waves of nausea and dizziness.   He is having some HAs, no vision change.  No vomitting.  No longer having neck pain.  His chest wall is sore and bruised.  Denies feeling SOB or pleurisy.  No fevers or cough.  Has mid back pain from getting pinned.  Current Medications (verified): 1)  Simvastatin 40 Mg  Tabs (Simvastatin) .... Take One Tanlet By Mouth Once A Day 2)  Metformin Hcl 1000 Mg Tabs (Metformin Hcl) .Marland Kitchen.. 1 Tab By Mouth Two Times A Day 3)  Novofine 30g X 8 Mm  Misc (Insulin Pen Needle) .... Use As Directed Daily 4)  Levemir Flexpen 100 Unit/ml  Soln (Insulin Detemir) .Marland Kitchen.. 15 Units [place] Qhs 5)  Viagra 100 Mg  Tabs (Sildenafil Citrate) .... 1/2 To 1 Tab By Mouth X 1 As Needed 6)  Aspirin Adult Low  Strength 81 Mg Tbec (Aspirin) .... Take 1 Tablet By Mouth Once A Day 7)  Compounded Topical Testosterone 25% Cream .... 10 Ml  1/2 Cc Two Times A Day 8)  Truetrack Test  Strp (Glucose Blood) .... Use As Directed To Check Blood Sugar Six Times Daily 9)  True Track Glucometer .... Use As Directed Dx: 250.00  Allergies (verified): No Known Drug Allergies  Comments:  Nurse/Medical Assistant: The patient's medications and allergies were reviewed with the patient and were updated in the Medication and Allergy Lists. Kathlene November (Apr 30, 2010 1:11 PM)  Past History:  Past Medical History: DM 1-09 High cholestrol hypogonadism obesity  work accident 04-2010  Social History: Reviewed history from 12/16/2007 and no changes required. 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 Has a daughter and 2 grandkids, loca. Never smoked. Denies ETOH. Walks 30 min a day. Poor diet.  Review of Systems      See HPI  Physical Exam  General:  alert and well-nourished.  here with daughter, obese, in a cervical collar Head:  normocephalic and atraumatic.   Eyes:  pupils equal, pupils  round, and pupils reactive to light.   Ears:  sutures over R superior ear with erythema Nose:  no nasal discharge.   Mouth:  good dentition and pharynx pink and moist.   Neck:  supple and full ROM.  tender suboccipital muscles.  able to move w/o cervical collar Chest Wall:  purpuric bruising R and L chest wall Lungs:  Normal respiratory effort, chest expands symmetrically. Lungs are clear to auscultation, no crackles or wheezes.  slight splinting on full inspiration Heart:  normal rate, regular rhythm, and no murmur.   Abdomen:  soft and non-tender.   Msk:  tender over mid thoracic region, midline.  Pulses:  2+ radial and pedal pulses Extremities:  no LE edema mild to moderate edema over the R wrist/ hand Neurologic:  grip + 4/5 R side Skin:  healing partially open laceration over the distal Rwrist with serosang.  drainage.  No bleeding.  localized edema Cervical Nodes:  No lymphadenopathy noted Psych:  good eye contact, not anxious appearing, and not depressed appearing.     Impression & Recommendations:  Problem # 1:  WOUND, ARM (ICD-884.0) On last day of Keflex.  Just saw surgeon yesterday for suture removal.  Still has localized edema.  Re dressed today.  Will get CBC to check for elevated WBC with left shift given recent high sugars.  Continue wound care.  Recheck in 10 day. Orders: T-CBC w/Diff ([id number])  Problem # 2:  CONCUSSION (ICD-850.9) Had CT head--> negative.  Having symptoms of post concussive syndrome with dizziness and nausea. Will keep him out of work till 6-20 at least.  He is to take it easy -- use medications given to help his symptoms and f/u with me in 10 days.  Problem # 3:  CONTUSION, CHEST WALL (ICD-922.1) Improving.  Has had normal CXRs.  Breathing deeply with minimal pain.  Bruises will improve with time.  Problem # 4:  NECK PAIN (ICD-723.1) Improved.  After 1 wk of cervical collar, will wean off over the next 5 days.  Cleared by CT imaging. His updated medication list for this problem includes:    Aspirin Adult Low Strength 81 Mg Tbec (Aspirin) .Marland Kitchen... Take 1 tablet by mouth once a day    Flexeril 5 Mg Tabs (Cyclobenzaprine hcl) .Marland Kitchen... 1-2 tabs by mouth q 8 hrs as needed pain    Vicodin 5-500 Mg Tabs (Hydrocodone-acetaminophen) .Marland Kitchen... 1 tab by mouth two times a day as needed severe pain    Meloxicam 7.5 Mg Tabs (Meloxicam) .Marland Kitchen... 1-2 tabs by mouth once a day with food for pain  Complete Medication List: 1)  Simvastatin 40 Mg Tabs (Simvastatin) .... Take one tanlet by mouth once a day 2)  Metformin Hcl 1000 Mg Tabs (Metformin hcl) .Marland Kitchen.. 1 tab by mouth two times a day 3)  Novofine 30g X 8 Mm Misc (Insulin pen needle) .... Use as directed daily 4)  Levemir Flexpen 100 Unit/ml Soln (Insulin detemir) .... 20 units [place] qhs 5)  Viagra 100 Mg Tabs (Sildenafil citrate) ....  1/2 to 1 tab by mouth x 1 as needed 6)  Aspirin Adult Low Strength 81 Mg Tbec (Aspirin) .... Take 1 tablet by mouth once a day 7)  Compounded Topical Testosterone 25% Cream  .... 10 ml  1/2 cc two times a day 8)  Truetrack Test Strp (Glucose blood) .... Use as directed to check blood sugar six times daily 9)  True Track Glucometer  .... Use as directed dx: 250.00 10)  Flexeril 5 Mg Tabs (Cyclobenzaprine hcl) .Marland Kitchen.. 1-2 tabs by mouth q 8 hrs as needed pain 11)  Vicodin 5-500 Mg Tabs (Hydrocodone-acetaminophen) .Marland Kitchen.. 1 tab by mouth two times a day as needed severe pain 12)  Meloxicam 7.5 Mg Tabs (Meloxicam) .Marland Kitchen.. 1-2 tabs by mouth once a day with food for pain 13)  Zofran 8 Mg Tabs (Ondansetron hcl) .Marland Kitchen.. 1 tab by mouth q 8 hrs as needed nausea  Patient Instructions: 1)  CBC today. 2)  Will call you w/ results tomorrow. 3)  Keep arm wound clean with antibacterial soap and water, polysporin ointment and a gauze dressing.   4)  Wean down on usage of cervical collar over the next 4 days. 5)  Use Flexeril for muscle pain (it causes sedation). 6)  Take Meloxicam as your anti- inflammatory. 7)  Use Vicodin for severe pain, take with food. 8)  Use Zofran as needed for nausea. 9)  Increase Levemir to 20 units/ day. 10)  Return for f/u in 10 days. Prescriptions: ZOFRAN 8 MG TABS (ONDANSETRON HCL) 1 tab by mouth q 8 hrs as needed nausea  #24 x 0   Entered and Authorized by:   Seymour Bars DO   Signed by:   Seymour Bars DO on 04/30/2010   Method used:   Electronically to        [facility] [phone]* (retail)       [address]       [place], [place]  [address]       Ph: [phone] or [phone]       Fax: [phone]   RxID:   [id number] MELOXICAM 7.5 MG TABS (MELOXICAM) 1-2 tabs by mouth once a day with food for pain  #60 x 1   Entered and Authorized by:   Seymour Bars DO   Signed by:   Seymour Bars DO on 04/30/2010   Method used:   Electronically to        [facility] [phone]*  (retail)       [address] [place], [place]  [address]       Ph: [phone] or [phone]       Fax: [phone]   RxID:   [id number] VICODIN 5-500 MG TABS (HYDROCODONE-ACETAMINOPHEN) 1 tab by mouth two times a day as needed severe pain  #40 x 0   Entered and Authorized by:   Seymour Bars DO   Signed by:   Seymour Bars DO on 04/30/2010   Method used:   Printed then faxed to ...       [facility] [address] [phone]* (retail)       [address] [place], [place]  [address]       Ph: [phone] or [phone]       Fax: [phone]   RxID:   [id number] FLEXERIL 5 MG TABS (CYCLOBENZAPRINE HCL) 1-2 tabs by mouth q 8 hrs as needed pain  #60 x 1   Entered and Authorized by:   Seymour Bars DO   Signed by:   Seymour Bars DO on 04/30/2010   Method used:   Printed then faxed to ...       [facility] [address] [facility] [address] [phone]* (retail)       [address] [place], [place]  [address]       Ph: [phone] or [phone]  Fax: [phone]   RxID:   [id number]

## 2011-01-07 NOTE — Letter (Signed)
Summary: Letter Regarding Link to Wellness Program/Med Link  Letter Regarding Link to Wellness Program/Med Link   Imported By: Lanelle Bal 02/12/2010 08:13:08  _____________________________________________________________________  External Attachment:    Type:   Image     Comment:   External Document

## 2011-01-07 NOTE — Assessment & Plan Note (Signed)
Summary: f/u DM   Vital Signs:  Patient profile:   60 year old male Height:      66.75 inches Weight:      224 pounds BMI:     35.47 O2 Sat:      97 % on Room air Pulse rate:   77 / minute BP sitting:   116 / 78  (left arm) Cuff size:   large  Vitals Entered By: Payton Spark CMA (September 18, 2010 8:55 AM)  O2 Flow:  Room air CC: F/U DM   Primary Care Provider:  Seymour Bars DO  CC:  F/U DM.  History of Present Illness: 60 yo WM presents for f/u DM.  Ever since his work related accident in May, his sugars have run higher.  His A1C is up from 7--> 9.  AM fasting running  ~150.  He has fair dietary adherence.  he is walking at his job.  Denies blurry vision, paresthesias but has frequent urination.  He is working 2nd shift.  Eating 3 meals a day.  Going to bed at 2 am and getting up at 7 am.  Not napping.      Current Medications (verified): 1)  Simvastatin 40 Mg  Tabs (Simvastatin) .... Take One Tanlet By Mouth Once A Day 2)  Metformin Hcl 1000 Mg Tabs (Metformin Hcl) .Marland Kitchen.. 1 Tab By Mouth Two Times A Day 3)  Novofine 30g X 8 Mm  Misc (Insulin Pen Needle) .... Use As Directed Daily 4)  Levemir Flexpen 100 Unit/ml  Soln (Insulin Detemir) .... 20 Units [place] At Bedtime 5)  Viagra 100 Mg  Tabs (Sildenafil Citrate) .... 1/2 To 1 Tab By Mouth X 1 As Needed 6)  Aspirin Adult Low Strength 81 Mg Tbec (Aspirin) .... Take 1 Tablet By Mouth Once A Day 7)  Truetrack Test  Strp (Glucose Blood) .... Use As Directed To Check Blood Sugar Six Times Daily 8)  True Track Glucometer .... Use As Directed Dx: 250.00  Allergies (verified): No Known Drug Allergies  Past History:  Past Medical History: Reviewed history from 04/30/2010 and no changes required. DM 1-09 High cholestrol hypogonadism obesity  work accident 04-2010  Social History: Reviewed history from 12/16/2007 and no changes required. 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 Has a daughter and 2 grandkids, loca. Never  smoked. Denies ETOH. Walks 30 min a day. Poor diet.  Review of Systems      See HPI  Physical Exam  General:  alert, well-developed, well-nourished, and well-hydrated.  obese Head:  normocephalic and atraumatic.   Eyes:  pupils equal, pupils round, and pupils reactive to light.   Mouth:  pharynx pink and moist.   Neck:  no masses.   Lungs:  Normal respiratory effort, chest expands symmetrically. Lungs are clear to auscultation, no crackles or wheezes. Heart:  Normal rate and regular rhythm. S1 and S2 normal without gallop, murmur, click, rub or other extra sounds. Extremities:  L>R LE varicose veins, non tender and w/o leg swelling Skin:  color normal.   Psych:  good eye contact, not anxious appearing, and not depressed appearing.     Impression & Recommendations:  Problem # 1:  DIABETES MELLITUS, TYPE II (ICD-250.00) U micro normal today.  A1C is up to 9 from 7.4 (likely due to working 2nd shift now and not getting adequate sleep).  Will increase his Levemir from 20--> 26 units once daily, continue metformin, work on diabetic diet and exercise.  had flu shot already and  is calling to updtate his eye exam.  BP at goal.  Hold off on starting ACEi.  On statin, labs UTD.  RTC for f/u in 3 mos. His updated medication list for this problem includes:    Metformin Hcl 1000 Mg Tabs (Metformin hcl) .Marland Kitchen... 1 tab by mouth two times a day    Levemir Flexpen 100 Unit/ml Soln (Insulin detemir) .Marland Kitchen... 26  units [place] every morning    Aspirin Adult Low Strength 81 Mg Tbec (Aspirin) .Marland Kitchen... Take 1 tablet by mouth once a day  Orders: Fingerstick ([address]) Hemoglobin A1C ([address]) Urine Microalbumin ([address])  Problem # 2:  HYPOGONADISM (ICD-257.2) Off testosterone for 2.5 mos now after level was too high on compounded tx. Will recheck level to see where he's at.  Orders: T-Testosterone; Total [id number])  Complete Medication List: 1)  Simvastatin 40 Mg Tabs (Simvastatin) .... Take one tanlet by mouth  once a day 2)  Metformin Hcl 1000 Mg Tabs (Metformin hcl) .Marland Kitchen.. 1 tab by mouth two times a day 3)  Novofine 30g X 8 Mm Misc (Insulin pen needle) .... Use as directed daily 4)  Levemir Flexpen 100 Unit/ml Soln (Insulin detemir) .... 26  units [place] every morning 5)  Viagra 100 Mg Tabs (Sildenafil citrate) .... 1/2 to 1 tab by mouth x 1 as needed 6)  Aspirin Adult Low Strength 81 Mg Tbec (Aspirin) .... Take 1 tablet by mouth once a day 7)  Truetrack Test Strp (Glucose blood) .... Use as directed to check blood sugar six times daily 8)  True Track Glucometer  .... Use as directed dx: 250.00  Patient Instructions: 1)  Check testosterone level after you check out your 'preferred lab' with your insurance company. 2)  Increase Levemir to 26 units every morning. 3)  Stay on Metformin 2 x a day. 4)  Work on LOW CARB/ diabetic diet/ plenty of walking. 5)  Schedule your eye exam. 6)  Will call you w/ testosterone results once I get them back. 7)  Aim for 7 hrs of sleep per day! 8)  Return for follow up in 3 mos. Prescriptions: LEVEMIR FLEXPEN 100 UNIT/ML  SOLN (INSULIN DETEMIR) 26  units [place] every morning  #2 boxes x 3   Entered and Authorized by:   Seymour Bars DO   Signed by:   Seymour Bars DO on 09/18/2010   Method used:   Electronically to        [facility] [phone]* (retail)       [address] [place], [place]  [address]       Ph: [phone] or [phone]       Fax: [phone]   RxID:   [id number]   Laboratory Results   Urine Tests    Microalbumin (urine): 30 mg/L Creatinine: 200mg /dL  A:C Ratio <43  Blood Tests     HGBA1C: 9.0%   (Normal Range: Non-Diabetic - 3-6%   Control Diabetic - 6-8%)       Appended Document: f/u DM

## 2011-01-07 NOTE — Progress Notes (Signed)
Summary: DM program  Phone Note Call from Patient   Caller: Patient Summary of Call: Pt is participating in DM class/program @ [facility]. Pt needs new meter (?True Track), strips enough to test 6x daily, and 90 day supply of metformin  sent to [facility] pharm. Please advise.  Initial call taken by: Payton Spark CMA,  February 06, 2010 10:40 AM    New/Updated Medications: TRUETRACK BLOOD GLUCOSE  DEVI (BLOOD GLUCOSE MONITORING SUPPL) Use as directed to check blood sugar TRUETRACK TEST  STRP (GLUCOSE BLOOD) Use as directed to check blood sugar six times daily * TRUE TRACK GLUCOMETER use as directed Dx: 250.00 Prescriptions: METFORMIN HCL 1000 MG TABS (METFORMIN HCL) 1 tab by mouth two times a day  #180 x 3   Entered and Authorized by:   Seymour Bars DO   Signed by:   Seymour Bars DO on 02/06/2010   Method used:   Electronically to        [facility]* (retail)       [address]       [address] Shipping/mailing       [place], [place]  [address]       Ph: [phone]       Fax: [phone]   RxID:   [id number] TRUETRACK TEST  STRP (GLUCOSE BLOOD) Use as directed to check blood sugar six times daily  #180 x 2   Entered and Authorized by:   Seymour Bars DO   Signed by:   Seymour Bars DO on 02/06/2010   Method used:   Printed then faxed to ...       [facility]* (retail)       [address]       [address] Shipping/mailing       [place], [place]  [address]       Ph: [phone]       Fax: [phone]   RxID:   [id number] TRUE TRACK GLUCOMETER use as directed Dx: 250.00  #1 x 0   Entered and Authorized by:   Seymour Bars DO   Signed by:   Seymour Bars DO on 02/06/2010   Method used:   Printed then faxed to ...       [facility]* (retail)       [address]       [address] Shipping/mailing       [place], [place]  [address]       Ph: [phone]       Fax: [phone]   RxID:   [id number]   Appended Document: DM  program

## 2011-01-07 NOTE — Miscellaneous (Signed)
Summary: PT Discharge/[facility]  PT Discharge/[facility]   Imported By: Lanelle Bal 07/03/2010 09:36:02  _____________________________________________________________________  External Attachment:    Type:   Image     Comment:   External Document

## 2011-01-09 NOTE — Progress Notes (Signed)
Summary: 90 day supply   Phone Note Refill Request Message from:  Patient on December 10, 2010 9:42 AM  Patient is asking for a 90 day supply on all his meds except for insulin because it is cheaper that way.... [facility] [address]... Any questions please call [phone]   Method Requested: Electronic Next Appointment Scheduled: 01/01/11 Initial call taken by: Michaelle Copas,  December 10, 2010 9:42 AM    Prescriptions: METFORMIN HCL 1000 MG TABS (METFORMIN HCL) 1 tab by mouth two times a day  #180 x 1   Entered by:   Payton Spark CMA   Authorized by:   Seymour Bars DO   Signed by:   Payton Spark CMA on 12/10/2010   Method used:   [facility]ically [facility]        [phone][facility] (retail)       [address]       [place], [place]  [address]       Ph: [phone] or [phone]       Fax: [phone]   RxID:   [id number] SIMVASTATIN 40 MG  TABS (SIMVASTATIN) Take one tanlet by mouth once a day  #90 x 1   Entered by:   Payton Spark CMA   Authorized by:   Seymour Bars DO   Signed by:   Payton Spark CMA on 12/10/2010   Method used:   Electronically to        [facility] [phone]* (retail)       [address]       [place], [place]  [address]       Ph: [phone] or [phone]       Fax: [phone]   RxID:   [id number]

## 2011-01-09 NOTE — Assessment & Plan Note (Signed)
Summary: f/u DM   Vital Signs:  Patient profile:   60 year old male Height:      66.75 inches Weight:      227 pounds BMI:     35.95 O2 Sat:      98 % on Room air Pulse rate:   61 / minute BP sitting:   123 / 76  (left arm) Cuff size:   large  Vitals Entered By: Payton Spark CMA (January 01, 2011 8:33 AM)  O2 Flow:  Room air CC: F/U DM   Primary Care Provider:  Seymour Bars DO  CC:  F/U DM.  History of Present Illness: 60 yo WM presents for f/u IDDM and low testosterone.  He had high sugar readings over the Holidays but reports not sticking to his diabetic diet.  We did increase his Levemir from 20-->26 units once a day.  He is still on Metformin 1 gram two times a day.  He is sscheduled for his diabetiic eye exam next wk.    fasting labs are due.  Repeat renal u/s for a cyst is due in April. Denies any problems with chest pain or DOE.  He was restarted on Androgel (previously on RX compunded testosterone) and is due for repeat labs since it has been 2 mos.  he has noticed improved energy level and libido w/o adverse SEs.    Allergies: No Known Drug Allergies  Review of Systems      See HPI  Physical Exam  General:  alert, well-developed, well-nourished, and well-hydrated.  obese Eyes:  pupils equal, pupils round, and pupils reactive to light.   Mouth:  pharynx pink and moist.   Neck:  no masses.   Lungs:  Normal respiratory effort, chest expands symmetrically. Lungs are clear to auscultation, no crackles or wheezes. Heart:  Normal rate and regular rhythm. S1 and S2 normal without gallop, murmur, click, rub or other extra sounds. Extremities:  L>R LE varicose veins, non tender and w/o leg swelling Skin:  color normal.   Psych:  good eye contact, not anxious appearing, and not depressed appearing.     Impression & Recommendations:  Problem # 1:  DIABETES MELLITUS, TYPE II (ICD-250.00) A1C 7.9 from 9 on slightly higher dose of Levemir.  His home sugars have much  improved after Xmas and now are at goal.  He still has some work to do with improving diet and exercise and work on wt loss.  Cotninue current meds.    Update fasting labs.  He is set up for eye exam. His updated medication list for this problem includes:    Metformin Hcl 1000 Mg Tabs (Metformin hcl) .Marland Kitchen... 1 tab by mouth two times a day    Levemir Flexpen 100 Unit/ml Soln (Insulin detemir) .Marland Kitchen... 26  units [place] every morning    Aspirin Adult Low Strength 81 Mg Tbec (Aspirin) .Marland Kitchen... Take 1 tablet by mouth once a day    Lisinopril 5 Mg Tabs (Lisinopril) .Marland Kitchen... 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 Even with normal BP, will add a low dose of lisinopril once daily to help protect kidneys.  Problem # 3:  HYPERCHOLESTEROLEMIA (ICD-272.0)  His updated medication list for this problem includes:    Simvastatin 40 Mg Tabs (Simvastatin) .Marland Kitchen... Take one tanlet by mouth once a day  Orders: T-Lipid Profile (  [id number])  Labs Reviewed: SGOT: 25 (06/12/2010)   SGPT: 20 (06/12/2010)   HDL:36 (06/12/2010), 44 (04/18/2009)  LDL:68 (06/12/2010), 81 (04/18/2009)  Chol:128 (06/12/2010), 148 (04/18/2009)  Trig:122 (06/12/2010), 116 (04/18/2009)  Problem # 4:  HYPOGONADISM (ICD-257.2) Clinically improving but is due for labs today. Orders: T-PSA ([id number]) T-Testosterone; Total [phone])  Problem # 5:  RENAL CYST (ICD-593.2) Will repeat his renal u/s in April.  Complete Medication List: 1)  Simvastatin 40 Mg Tabs (Simvastatin) .... Take one tanlet by mouth once a day 2)  Metformin Hcl 1000 Mg Tabs (Metformin hcl) .Marland Kitchen.. 1 tab by mouth two times a day 3)  Novofine 30g X 8 Mm Misc (Insulin pen needle) .... Use as directed daily 4)  Levemir Flexpen 100 Unit/ml Soln (Insulin detemir) .... 26  units [place] every  morning 5)  Viagra 100 Mg Tabs (Sildenafil citrate) .... 1/2 to 1 tab by mouth x 1 as needed 6)  Aspirin Adult Low Strength 81 Mg Tbec (Aspirin) .... Take 1 tablet by mouth once a day 7)  Truetrack Test Strp (Glucose blood) .... Use as directed to check blood sugar six times daily 8)  True Track Glucometer  .... Use as directed dx: 250.00 9)  Andrgoel 1.62%  .Marland Kitchen.. 1 pump press over each upper arm once daily as directed 10)  Lisinopril 5 Mg Tabs (Lisinopril) .Marland Kitchen.. 1 tab by mouth once daily  Other Orders: T-CBC No Diff ([id number])  Patient Instructions: 1)  A1C down to 7.9 from 9 (<7 is goal). 2)  Stay on Levemir 26 units in the morning and Metformin 1000 mg two times a day. 3)  Work on low carb, low sugar diet with regular exercise. 4)  Urine micro +++ for protein so lisinopril 5 mg once daily added to protect kidneys. 5)  REcheck labs today.  Will call you w/ results tomorrow. 6)  REturn for f/u in 4 mos. 7)  Renal u/s repeat is due at f/u visit. Prescriptions: LISINOPRIL 5 MG TABS (LISINOPRIL) 1 tab by mouth once daily  #30 x 2   Entered and Authorized by:   Seymour Bars DO   Signed by:   Seymour Bars DO on 01/01/2011   Method used:   [facility]        [facility] [phone][facility] (retail)       [address]       [place], [place]  [address]       Ph: [phone] or [phone]       Fax: [phone]   RxID:   [id number]    Orders Added: 1)  Fingerstick [36416] 2)  Hemoglobin A1C [83036] 3)  T-CBC No Diff [[address]27-10000] 4)  T-Comprehensive Metabolic Panel [80053-22900] 5)  T-Lipid Profile [80061-22930] 6)  T-PSA [54270-62376] 7)  T-Testosterone; Total [phone] 8)  Est. Patient Level IV [99214]    Laboratory Results   Urine Tests    Microalbumin (urine): 80 mg/L Creatinine: 200mg /dL  A:C Ratio 07-371  Blood Tests     HGBA1C: 7.9%   (Normal Range: Non-Diabetic - 3-6%   Control Diabetic - 6-8%)

## 2011-01-19 ENCOUNTER — Encounter: Payer: Self-pay | Admitting: Family Medicine

## 2011-01-24 ENCOUNTER — Encounter: Payer: Self-pay | Admitting: Emergency Medicine

## 2011-01-24 ENCOUNTER — Ambulatory Visit (INDEPENDENT_AMBULATORY_CARE_PROVIDER_SITE_OTHER): Payer: BC Managed Care – PPO | Admitting: Emergency Medicine

## 2011-01-24 DIAGNOSIS — L03317 Cellulitis of buttock: Secondary | ICD-10-CM

## 2011-01-24 DIAGNOSIS — L0231 Cutaneous abscess of buttock: Secondary | ICD-10-CM | POA: Insufficient documentation

## 2011-01-25 ENCOUNTER — Encounter: Payer: Self-pay | Admitting: Emergency Medicine

## 2011-01-25 ENCOUNTER — Ambulatory Visit (INDEPENDENT_AMBULATORY_CARE_PROVIDER_SITE_OTHER): Payer: BC Managed Care – PPO | Admitting: Family Medicine

## 2011-01-25 ENCOUNTER — Encounter: Payer: Self-pay | Admitting: Family Medicine

## 2011-01-25 DIAGNOSIS — L0231 Cutaneous abscess of buttock: Secondary | ICD-10-CM

## 2011-01-26 ENCOUNTER — Encounter: Payer: Self-pay | Admitting: Family Medicine

## 2011-01-26 ENCOUNTER — Ambulatory Visit (INDEPENDENT_AMBULATORY_CARE_PROVIDER_SITE_OTHER): Payer: BC Managed Care – PPO | Admitting: Family Medicine

## 2011-01-26 DIAGNOSIS — L03317 Cellulitis of buttock: Secondary | ICD-10-CM

## 2011-01-27 ENCOUNTER — Telehealth (INDEPENDENT_AMBULATORY_CARE_PROVIDER_SITE_OTHER): Payer: Self-pay | Admitting: *Deleted

## 2011-01-28 ENCOUNTER — Encounter: Payer: Self-pay | Admitting: Family Medicine

## 2011-01-29 NOTE — Letter (Signed)
Summary: Controlled medications RX policy  Controlled medications RX policy   Imported By: Dannette Barbara 01/24/2011 14:13:32  _____________________________________________________________________  External Attachment:    Type:   Image     Comment:   External Document

## 2011-01-29 NOTE — Miscellaneous (Signed)
Summary: no retinopathy  Clinical Lists Changes  Observations: Added new observation of DIAB EYE EX: no retinopathy (Dr Arnoldo Lenis) (12/30/2010 11:22)

## 2011-01-29 NOTE — Assessment & Plan Note (Signed)
Summary: BOIL ON BUTTOCK/WB procedure rm   Vital Signs:  Patient Profile:   60 Years Old Male CC:      boil on buttock Height:     66.75 inches Weight:      227.50 pounds O2 Sat:      96 % O2 treatment:    Room Air Temp:     98.8 degrees F oral Pulse rate:   100 / minute Resp:     18 per minute BP standing:   116 / 82  (left arm) Cuff size:   regular  Vitals Entered By: Clemens Catholic LPN (January 24, 2011 11:51 AM)                  Updated Prior Medication List: SIMVASTATIN 40 MG  TABS (SIMVASTATIN) Take one tanlet by mouth once a day METFORMIN HCL 1000 MG TABS (METFORMIN HCL) 1 tab by mouth two times a day NOVOFINE 30G X 8 MM  MISC (INSULIN PEN NEEDLE) use as directed daily LEVEMIR FLEXPEN 100 UNIT/ML  SOLN (INSULIN DETEMIR) 26  units [place] every morning VIAGRA 100 MG  TABS (SILDENAFIL CITRATE) 1/2 to 1 tab by mouth x 1 as needed ASPIRIN ADULT LOW STRENGTH 81 MG TBEC (ASPIRIN) Take 1 tablet by mouth once a day TRUETRACK TEST  STRP (GLUCOSE BLOOD) Use as directed to check blood sugar six times daily * TRUE TRACK GLUCOMETER use as directed Dx: 250.00 * ANDRGOEL 1.62% 1 pump press over each upper arm once daily as directed [BMN] LISINOPRIL 5 MG TABS (LISINOPRIL) 1 tab by mouth once daily  Current Allergies (reviewed today): No known allergies History of Present Illness History from: patient Chief Complaint: boil on buttock History of Present Illness: Boil on R buttock for the past few days.  Has been using antibiotic ointment, but it's getting worse.  Painful like a toothache, worst when sitting down.  He is diabetic but maintains sugars around 120.  No F/C/V.  Mild Nausea.  No drainage noted.  No problems with urination or BM  REVIEW OF SYSTEMS Constitutional Symptoms      Denies fever, chills, night sweats, weight loss, weight gain, and fatigue.  Eyes       Denies change in vision, eye pain, eye discharge, glasses, contact lenses, and eye  surgery. Ear/Nose/Throat/Mouth       Denies hearing loss/aids, change in hearing, ear pain, ear discharge, dizziness, frequent runny nose, frequent nose bleeds, sinus problems, sore throat, hoarseness, and tooth pain or bleeding.  Respiratory       Denies dry cough, productive cough, wheezing, shortness of breath, asthma, bronchitis, and emphysema/COPD.  Cardiovascular       Denies murmurs, chest pain, and tires easily with exhertion.    Gastrointestinal       Denies stomach pain, nausea/vomiting, diarrhea, constipation, blood in bowel movements, and indigestion. Genitourniary       Denies painful urination, kidney stones, and loss of urinary control. Neurological       Denies paralysis, seizures, and fainting/blackouts. Musculoskeletal       Denies muscle pain, joint pain, joint stiffness, decreased range of motion, redness, swelling, muscle weakness, and gout.  Skin       Denies bruising, unusual mles/lumps or sores, and hair/skin or nail changes.  Psych       Denies mood changes, temper/anger issues, anxiety/stress, speech problems, depression, and sleep problems. Other Comments: pt states that he has a red, raised, hard bump on his buttock area. he has applied  neosporin to the area. he states that today he feels nauseated, pain radiating into groin area, and he has a lot of pain.   Past History:  Past Medical History: Reviewed history from 04/30/2010 and no changes required. DM 1-09 High cholestrol hypogonadism obesity  work accident 04-2010  Family History: Reviewed history from 12/16/2007 and no changes required. father alive, AMI at 34, HTN mother dementia 2 sisters HTN 1 brother HTN  Social History: Reviewed history from 12/16/2007 and no changes required. 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 Has a daughter and 2 grandkids, loca. Never smoked. Denies ETOH. Walks 30 min a day. Poor diet. Physical Exam General appearance: well developed, well nourished, mild  distress Skin: see below MSE: oriented to time, place, and person R buttock has a 2cm area of induration, no fluctuance.  TTP.  No drainage noted.  Mild erythema surrounding the lesion.   Assessment New Problems: CELLULITIS AND ABSCESS OF BUTTOCK (ICD-682.5)   Patient Education: Patient and/or caregiver instructed in the following: rest, fluids, Tylenol prn, Ibuprofen prn.  Plan New Medications/Changes: DOXYCYCLINE HYCLATE 100 MG CAPS (DOXYCYCLINE HYCLATE) 1 by mouth two times a day for 10 days  #20 x 0, 01/24/2011, Hoyt Koch MD BACTRIM DS 800-160 MG TABS (SULFAMETHOXAZOLE-TRIMETHOPRIM) 1 by mouth two times a day for 10 days  #20 x 0, 01/24/2011, Hoyt Koch MD  New Orders: Est. Patient Level IV [36644] I&D Abscess, Simple / Single [10060] Capillary Blood Glucose/CBG [82948] T-Culture, Wound [87070/[id number]] Planning Comments:   Heating pad to promote drainage.  Don't use ointments.  Take both antibiotics until gone.  A wound culture is pending but I suspect that it won't grow anything since very little came out upon I&D.  OTC pain meds since he prefers no Rx pain meds.  Hydration and rest.  Work note given.  He should return in 2-3 days for a recheck of the wound.   The patient and/or caregiver has been counseled thoroughly with regard to medications prescribed including dosage, schedule, interactions, rationale for use, and possible side effects and they verbalize understanding.  Diagnoses and expected course of recovery discussed and will return if not improved as expected or if the condition worsens. Patient and/or caregiver verbalized understanding.   PROCEDURE:   I & D Site: R buttock Size: 2cm Procedure: Discussed benefits and risks of procedure and verbal consent obtained.  Using sterile technique (betadine) and local 1% lidocaine without epinephrine, injected 2cc into area to numb.  Then used scalpel to incise.  Very minimal drainage and a few cc's of blood  expressed.  Pain was relieved with the I&D.  Patient tolerated the procedure well.  Non-stick sterile dressing applied.  Wound precautions explained to patient.  Prescriptions: DOXYCYCLINE HYCLATE 100 MG CAPS (DOXYCYCLINE HYCLATE) 1 by mouth two times a day for 10 days  #20 x 0   Entered and Authorized by:   Hoyt Koch MD   Signed by:   Hoyt Koch MD on 01/24/2011   Method used:   Print then Give to Patient   RxID:   [id number] BACTRIM DS 800-160 MG TABS (SULFAMETHOXAZOLE-TRIMETHOPRIM) 1 by mouth two times a day for 10 days  #20 x 0   Entered and Authorized by:   Hoyt Koch MD   Signed by:   Hoyt Koch MD on 01/24/2011   Method used:   Print then Give to Patient   RxID:   [id number]   Orders Added: 1)  Est. Patient Level IV [16010] 2)  I&D Abscess,  Simple / Single [10060] 3)  Capillary Blood Glucose/CBG [82948] 4)  T-Culture, Wound [87070/[id number]]    Laboratory Results   Blood Tests   Date/Time Received: January 24, 2011 12:21 PM  Date/Time Reported: January 24, 2011 12:21 PM   Glucose (random): 151 mg/dL   (Normal Range: 69-629)  Date/Time Received: January 24, 2011 12:21 PM  Date/Time Reported: January 24, 2011 12:21 PM

## 2011-01-29 NOTE — Assessment & Plan Note (Signed)
Summary: F/U boi/TM procedure rm   Vital Signs:  Patient Profile:   60 Years Old Male CC:      F/U boil Height:     66.75 inches O2 Sat:      100 % O2 treatment:    Room Air Temp:     97.7 degrees F oral Pulse rate:   83 / minute Resp:     18 per minute BP sitting:   129 / 80  (left arm) Cuff size:   regular  Vitals Entered By: Clemens Catholic LPN (January 25, 2011 9:19 AM)                  Updated Prior Medication List: SIMVASTATIN 40 MG  TABS (SIMVASTATIN) Take one tanlet by mouth once a day METFORMIN HCL 1000 MG TABS (METFORMIN HCL) 1 tab by mouth two times a day NOVOFINE 30G X 8 MM  MISC (INSULIN PEN NEEDLE) use as directed daily LEVEMIR FLEXPEN 100 UNIT/ML  SOLN (INSULIN DETEMIR) 26  units [place] every morning VIAGRA 100 MG  TABS (SILDENAFIL CITRATE) 1/2 to 1 tab by mouth x 1 as needed ASPIRIN ADULT LOW STRENGTH 81 MG TBEC (ASPIRIN) Take 1 tablet by mouth once a day TRUETRACK TEST  STRP (GLUCOSE BLOOD) Use as directed to check blood sugar six times daily * TRUE TRACK GLUCOMETER use as directed Dx: 250.00 * ANDRGOEL 1.62% 1 pump press over each upper arm once daily as directed [BMN] LISINOPRIL 5 MG TABS (LISINOPRIL) 1 tab by mouth once daily BACTRIM DS 800-160 MG TABS (SULFAMETHOXAZOLE-TRIMETHOPRIM) 1 by mouth two times a day for 10 days DOXYCYCLINE HYCLATE 100 MG CAPS (DOXYCYCLINE HYCLATE) 1 by mouth two times a day for 10 days  Current Allergies (reviewed today): No known allergies History of Present Illness Chief Complaint: F/U boil History of Present Illness:  Subjective:  Patient states that he started his antibiotics last night, and has had a second dose this morning.  This morning he noted pain, swelling, and redness over his right upper anterior/medial thigh radiating into right groin.  He denies fevers, chills, and sweats, but had some mild nausea after taking his antibiotics.  Minimal drainage from I and D site.  Glucose has not been out of control.  REVIEW OF  SYSTEMS Constitutional Symptoms      Denies fever, chills, night sweats, weight loss, weight gain, and fatigue.  Eyes       Denies change in vision, eye pain, eye discharge, glasses, contact lenses, and eye surgery. Ear/Nose/Throat/Mouth       Denies hearing loss/aids, change in hearing, ear pain, ear discharge, dizziness, frequent runny nose, frequent nose bleeds, sinus problems, sore throat, hoarseness, and tooth pain or bleeding.  Respiratory       Denies dry cough, productive cough, wheezing, shortness of breath, asthma, bronchitis, and emphysema/COPD.  Cardiovascular       Denies murmurs, chest pain, and tires easily with exhertion.    Gastrointestinal       Denies stomach pain, nausea/vomiting, diarrhea, constipation, blood in bowel movements, and indigestion. Genitourniary       Denies painful urination, kidney stones, and loss of urinary control. Neurological       Denies paralysis, seizures, and fainting/blackouts. Musculoskeletal       Denies muscle pain, joint pain, joint stiffness, decreased range of motion, redness, swelling, muscle weakness, and gout.  Skin       Denies bruising, unusual mles/lumps or sores, and hair/skin or nail changes.  Psych  Denies mood changes, temper/anger issues, anxiety/stress, speech problems, depression, and sleep problems. Other Comments: pt states that the boil on his buttocks area seems to be better today. he noticed red streaking and swelling down his RT leg last night.   Past History:  Past Medical History: Reviewed history from 04/30/2010 and no changes required. DM 1-09 High cholestrol hypogonadism obesity  work accident 04-2010  Family History: Reviewed history from 12/16/2007 and no changes required. father alive, AMI at 81, HTN mother dementia 2 sisters HTN 1 brother HTN  Social History: Reviewed history from 12/16/2007 and no changes required. 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 Has a daughter and 2 grandkids,  loca. Never smoked. Denies ETOH. Walks 30 min a day. Poor diet.   Objective:  No acute distress  Eyes:  Pupils are equal, round, and reactive to light and accomdation.  Extraocular movement is intact.  Conjunctivae are not inflamed.  Lungs:  Clear to auscultation.  Breath sounds are equal.  Heart:  Regular rate and rhythm without murmurs, rubs, or gallops.  Abdomen:  Nontender without masses or hepatosplenomegaly.  Bowel sounds are present.  No CVA or flank tenderness.  I and D site right inferior buttock:  No drainage noted.  Light erythema is present around wound extending to lateral buttock without tenderness or swelling.  Minimal tenderness around I and D site. Right leg:  Erythema and mild swelling proximal medial thigh extending to anterior thigh.  Anteriorly the area of erythema is tender but not fluctuant.  Erythema extends to right groin with swelling and tenderness over right inguinal nodes.  No involvement scrotum/penis. CBC:  WBC 14.0 with 13.9 LY, 7.6 MO, 78.5 GR Assessment  Assessed CELLULITIS AND ABSCESS OF BUTTOCK as deteriorated - Donna Christen MD  Plan New Orders: Rocephin  250mg  [J0696] Admin of Therapeutic Inj  intramuscular or subcutaneous [96372] CBC w/Diff [45409-81191] Est. Patient Level III [47829] Planning Comments:   Bandage applied to I and D site.  Rocephin 1gm IM.  Outlined areas of erythema with skin marker.  Awaiting culture results Increase Bactrim to two every 12 hours.  Continue heating pad.   Rest, increase fluid intake. Check temp several times daily. Return tomorrow for follow-up (repeat CBC).  If symptoms become significantly worse during the night or over the weekend, proceed to the local emergency room.   The patient and/or caregiver has been counseled thoroughly with regard to medications prescribed including dosage, schedule, interactions, rationale for use, and possible side effects and they verbalize understanding.  Diagnoses and expected  course of recovery discussed and will return if not improved as expected or if the condition worsens. Patient and/or caregiver verbalized understanding.   Patient Instructions: 1)  Continue doxycycline one capsule every 12 hours. 2)  Increase trimethoprim/sulfamethoxazole (Bactrim) to two every 12 hours.  3)  Take both with food.  Medication Administration  Injection # 1:    Medication: Rocephin  250mg     Diagnosis: CELLULITIS AND ABSCESS OF BUTTOCK (ICD-682.5)    Route: IM    Site: RUOQ gluteus    Exp Date: 04/07/2013    Lot #: [id number]    Mfr: Sandoz    Comments: 1gram given    Patient tolerated injection without complications    Given by: Burnard Hawthorne RN (January 25, 2011 10:10 AM)  Orders Added: 1)  Rocephin  250mg  [J0696] 2)  Admin of Therapeutic Inj  intramuscular or subcutaneous [96372] 3)  CBC w/Diff [[id number]] 4)  Est. Patient Level III [95284]

## 2011-01-29 NOTE — Letter (Signed)
Summary: Out of Work  [facility]  [address]   [place], [place] [address]   Phone: [phone]  Fax: [phone]    January 24, 2011   Employee:  [name]    To Whom It May Concern:   For Medical reasons, please excuse the above named employee from work for the following dates:  Feb 17-19, 2012           Sincerely,    Hoyt Koch MD

## 2011-01-31 ENCOUNTER — Telehealth (INDEPENDENT_AMBULATORY_CARE_PROVIDER_SITE_OTHER): Payer: Self-pay | Admitting: *Deleted

## 2011-02-04 NOTE — Progress Notes (Signed)
  Phone Note From Pharmacy   Caller: [facility]  [address] [phone]* Summary of Call: Pharmacist reports that patient missed a dose yesterday due to mistakenly throwing out the Rx for Bactrim. I okayed a refill on the remaining days of the Rx Initial call taken by: Lajean Saver RN,  January 31, 2011 12:04 PM

## 2011-02-04 NOTE — Progress Notes (Signed)
  Phone Note Outgoing Call Call back at Home Phone [phone] [facility]     Call placed by: Lajean Saver RN,  January 27, 2011 8:44 AM Call placed to: Patient Summary of Call: Per Dr. Donnamarie Poag instructions, informed patient his wound culture came back and he could stop taking the Doxycycline but to continue the Bactrim. He verbalized understanding.

## 2011-02-04 NOTE — Assessment & Plan Note (Signed)
Summary: F/U boil/TM(procedure rm)   Vital Signs:  Patient Profile:   60 Years Old Male CC:      wound recheck Height:     66.75 inches O2 Sat:      99 % O2 treatment:    Room Air Temp:     97.6 degrees F oral Resp:     20 per minute BP standing:   119 / 72  (left arm) Cuff size:   regular  Vitals Entered By: Burnard Hawthorne RN (January 26, 2011 1:23 PM)                  Prior Medication List:  SIMVASTATIN 40 MG  TABS (SIMVASTATIN) Take one tanlet by mouth once a day METFORMIN HCL 1000 MG TABS (METFORMIN HCL) 1 tab by mouth two times a day NOVOFINE 30G X 8 MM  MISC (INSULIN PEN NEEDLE) use as directed daily LEVEMIR FLEXPEN 100 UNIT/ML  SOLN (INSULIN DETEMIR) 26  units Marine every morning VIAGRA 100 MG  TABS (SILDENAFIL CITRATE) 1/2 to 1 tab by mouth x 1 as needed ASPIRIN ADULT LOW STRENGTH 81 MG TBEC (ASPIRIN) Take 1 tablet by mouth once a day TRUETRACK TEST  STRP (GLUCOSE BLOOD) Use as directed to check blood sugar six times daily * TRUE TRACK GLUCOMETER use as directed Dx: 250.00 * ANDRGOEL 1.62% 1 pump press over each upper arm once daily as directed [BMN] LISINOPRIL 5 MG TABS (LISINOPRIL) 1 tab by mouth once daily BACTRIM DS 800-160 MG TABS (SULFAMETHOXAZOLE-TRIMETHOPRIM) 1 by mouth two times a day for 10 days DOXYCYCLINE HYCLATE 100 MG CAPS (DOXYCYCLINE HYCLATE) 1 by mouth two times a day for 10 days   Current Allergies (reviewed today): No known allergies History of Present Illness Chief Complaint: wound recheck History of Present Illness:  Subjective:  Patient reports that he feels better today although he still has soreness around I and D site right buttock.  No longer has swelling/tenderness in right groin.  Erythema right leg decreased.  No fevers, chills, and sweats   REVIEW OF SYSTEMS Constitutional Symptoms      Denies fever, chills, night sweats, weight loss, weight gain, and fatigue.  Eyes       Denies change in vision, eye pain, eye discharge, glasses,  contact lenses, and eye surgery. Ear/Nose/Throat/Mouth       Denies hearing loss/aids, change in hearing, ear pain, ear discharge, dizziness, frequent runny nose, frequent nose bleeds, sinus problems, sore throat, hoarseness, and tooth pain or bleeding.  Respiratory       Denies dry cough, productive cough, wheezing, shortness of breath, asthma, bronchitis, and emphysema/COPD.  Cardiovascular       Denies murmurs, chest pain, and tires easily with exhertion.    Gastrointestinal       Denies stomach pain, nausea/vomiting, diarrhea, constipation, blood in bowel movements, and indigestion. Genitourniary       Denies painful urination, kidney stones, and loss of urinary control. Neurological       Denies paralysis, seizures, and fainting/blackouts. Musculoskeletal       Complains of muscle pain, redness, and swelling.      Denies joint pain, joint stiffness, decreased range of motion, muscle weakness, and gout.      Comments: right leg Skin       Denies bruising, unusual mles/lumps or sores, and hair/skin or nail changes.  Psych       Denies mood changes, temper/anger issues, anxiety/stress, speech problems, depression, and sleep problems. Other Comments: pt staes  some of the swelling has decreased but the redness has moved to his knee butremains painful   Past History:  Past Medical History: Reviewed history from 04/30/2010 and no changes required. DM 1-09 High cholestrol hypogonadism obesity  work accident 04-2010  Family History: Reviewed history from 12/16/2007 and no changes required. father alive, AMI at 69, HTN mother dementia 2 sisters HTN 1 brother HTN  Social History: Reviewed history from 12/16/2007 and no changes required. 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 Has a daughter and 2 grandkids, loca. Never smoked. Denies ETOH. Walks 30 min a day. Poor diet.   Objective:  No acute distress  Right buttock:  Erythema resolved.  I and D site still somewhat  indurated and tender but not fluctuant.  No drainage noted; dressing reapplied.   Right leg:  Erythema proximally resolved with decreased swelling.  No tenderness right inguinal area. CBC:  WBC 11.7 Assessment  Assessed CELLULITIS AND ABSCESS OF BUTTOCK as improved - Donna Christen MD CELLULITIS IMPROVING (WBC DECREASED).   CULTURE REVEALS STAPH, BUT SENSITIVITIES NOT YET AVAILABLE.  Plan New Medications/Changes: BACTRIM DS 800-160 MG TABS (SULFAMETHOXAZOLE-TRIMETHOPRIM) 2 by mouth two times a day  #24 x 0, 01/26/2011, Donna Christen MD  New Orders: Rocephin  250mg  [J0696] Admin of Therapeutic Inj  intramuscular or subcutaneous [96372] CBC w/Diff [16109-60454] Est. Patient Level III [09811] Planning Comments:   Rocephin 1gm IM.  Continue doxycycline and Septra DS. Continue applying heating pad, warm soaks, etc. Return if not resolved one week or if symptoms worsen.   The patient and/or caregiver has been counseled thoroughly with regard to medications prescribed including dosage, schedule, interactions, rationale for use, and possible side effects and they verbalize understanding.  Diagnoses and expected course of recovery discussed and will return if not improved as expected or if the condition worsens. Patient and/or caregiver verbalized understanding.  Prescriptions: BACTRIM DS 800-160 MG TABS (SULFAMETHOXAZOLE-TRIMETHOPRIM) 2 by mouth two times a day  #24 x 0   Entered and Authorized by:   Donna Christen MD   Signed by:   Donna Christen MD on 01/26/2011   Method used:   Print then Give to Patient   RxID:   [id number]   Medication Administration  Injection # 1:    Medication: Rocephin  250mg     Diagnosis: CELLULITIS AND ABSCESS OF BUTTOCK (ICD-682.5)    Route: IM    Site: LUOQ gluteus    Exp Date: 04/07/2013    Lot #: [id number]    Mfr: Sandoz    Comments: 1 gram given    Patient tolerated injection without complications    Given by: Clemens Catholic LPN (January 26, 2011 2:16 PM)  Orders Added: 1)  Rocephin  250mg  [J0696] 2)  Admin of Therapeutic Inj  intramuscular or subcutaneous [96372] 3)  CBC w/Diff [[id number]] 4)  Est. Patient Level III [40102]

## 2011-04-22 NOTE — Op Note (Signed)
NAME:  Jordan Marquez, Jordan Marquez NO.:  [id number]   MEDICAL RECORD NO.:  [id number]          PATIENT TYPE:  AMB   LOCATION:  ENDO                         FACILITY:  [facility]   PHYSICIAN:  Bernette Redbird, M.D.   DATE OF BIRTH:  February 06, 1951   DATE OF PROCEDURE:  09/19/2008  DATE OF DISCHARGE:                               OPERATIVE REPORT   PROCEDURE:  Colonoscopy with polypectomy and directed submucosal  injection.   INDICATIONS:  A 60 year old gentleman for initial screening colonoscopy.  He has no worrisome risk factors.   FINDINGS:  Medium-sized polyp, snared from the proximal ascending colon.   PROCEDURE:  The nature, purpose, and risks of the procedure had been  reviewed with the patient and provided written consent.  Sedation was  fentanyl 75 mcg and Versed 7 mg IV prior to and during the course of the  procedure without arrhythmias or desaturation.  Digital exam of the  prostate was unremarkable.  The Pentax adult video colonoscope was  advanced quite readily to the cecum, using a little bit of external  abdominal compression to enter the base of the cecum.  The cecum was  identified by clear visualization of the appendiceal orifice as well as  visualization of the ileocecal valve and pullback was then performed.  The quality of prep was excellent and it was felt that all areas were  well seen.   On the way in, I encountered a roughly 6 x 4-mm sessile polyp on the  edge of a fold by short distance above the cecum.  I injected the base  of this polyp with about 3 mL of saline and then used a cold snare to  cut the polyp off, which was accomplished very easily.  It appeared that  all polyp tissue was removed.  The polyp was retrieved by suctioning  through the scope.  There was no significant bleeding.   The remainder of the exam was normal.  No other polyps were seen and  there was no other evidence of cancer, colitis, vascular malformations,  or diverticulosis.   Retroflexion in the rectum and reinspection of the  rectum were unremarkable.   The patient tolerated the procedure well and there were no apparent  complications.   IMPRESSION:  Medium-sized colon polyp removed as described above and the  initial screening exam for a standard risk individual.   PLAN:  Await pathology results.           ______________________________  Bernette Redbird, M.D.     RB/MEDQ  D:  09/19/2008  T:  09/19/2008  Job:  [id number]   cc:   Seymour Bars, D.O.

## 2011-05-02 ENCOUNTER — Encounter: Payer: Self-pay | Admitting: Family Medicine

## 2011-05-07 ENCOUNTER — Encounter: Payer: Self-pay | Admitting: Family Medicine

## 2011-05-07 ENCOUNTER — Ambulatory Visit (INDEPENDENT_AMBULATORY_CARE_PROVIDER_SITE_OTHER): Payer: BC Managed Care – PPO | Admitting: Family Medicine

## 2011-05-07 VITALS — BP 120/74 | HR 63 | Ht 66.0 in | Wt 224.0 lb

## 2011-05-07 DIAGNOSIS — R809 Proteinuria, unspecified: Secondary | ICD-10-CM

## 2011-05-07 DIAGNOSIS — E119 Type 2 diabetes mellitus without complications: Secondary | ICD-10-CM

## 2011-05-07 DIAGNOSIS — N281 Cyst of kidney, acquired: Secondary | ICD-10-CM

## 2011-05-07 DIAGNOSIS — I10 Essential (primary) hypertension: Secondary | ICD-10-CM

## 2011-05-07 DIAGNOSIS — E291 Testicular hypofunction: Secondary | ICD-10-CM

## 2011-05-07 MED ORDER — INSULIN DETEMIR 100 UNIT/ML ~~LOC~~ SOLN: 31.0000 [IU] | SUBCUTANEOUS | Status: DC

## 2011-05-07 MED ORDER — SAXAGLIPTIN-METFORMIN ER 5-1000 MG PO TB24: ORAL_TABLET | ORAL | Status: DC

## 2011-05-07 NOTE — Assessment & Plan Note (Signed)
A1c is up to 8.9 from 7.9.  I increased his Levemir from 26--> 31 units once daily.  Will work more on dietary compliance and regular walking.  His eye exam is UTD and will repeat urine micro next visit.  Will repeat BMP today for new start ACEi.    Changed metformin 1 gram bid to Kombliglyze (due to adherence on bid regimen).  Call if any problems.

## 2011-05-07 NOTE — Assessment & Plan Note (Signed)
Repeat testosterone level.  PSA was check with liver and hct in Jan.  He is off topical testosterone due to cost and if low, is intersted in starting injections.

## 2011-05-07 NOTE — Assessment & Plan Note (Signed)
Doing well on lisinopril and BP looks  Perfect.  In 2 mos will recheck urine microalbumin.

## 2011-05-07 NOTE — Progress Notes (Signed)
  Subjective:    Patient ID: Jordan Marquez, male    DOB: 11/21/1951, 60 y.o.   MRN: [id number]  HPI 60 yo WM presents for f/u T2DM.  He is on Levemir 26 units qAM and metformin 1 gram bid.  His AM fastings are running 'a little higher'.  He is walking and staying active.  He is not eating breakfast.  He is working late at night.  He ran out of Androgel a month ago due to cost.  He has noticed a drop off in his libido.  He did update his eye exam recently.  He is doing great on lisinopril, added in Jan for microalbuminuria.  His last PSA was in Jan.  He has not had any low blood sugars.  He admits to some recently poor dietary choices.  BP 120/74  Pulse 63  Ht 5\' 6"  (1.676 m)  Wt 224 lb (101.606 kg)  BMI 36.15 kg/m2  SpO2 96%  Patient Active Problem List  Diagnoses  . DIABETES MELLITUS, TYPE II  . HYPOGONADISM  . HYPERCHOLESTEROLEMIA  . ERECTILE DYSFUNCTION  . ACUTE BRONCHITIS  . UNSPECIFIED DISORDER OF KIDNEY AND URETER  . NECK PAIN  . OTHER NONSPECIFIC ABNORMAL SERUM ENZYME LEVELS  . CONCUSSION  . WOUND, ARM  . CONTUSION, CHEST WALL  . RENAL CYST  . HYPERBILIRUBINEMIA  . MICROALBUMINURIA  . CELLULITIS AND ABSCESS OF BUTTOCK       Review of Systems  Constitutional: Positive for fatigue. Negative for unexpected weight change.  Eyes: Negative for visual disturbance.  Respiratory: Negative for shortness of breath.   Cardiovascular: Positive for leg swelling (minimal edema, itchy varicose veins). Negative for chest pain and palpitations.  Genitourinary: Negative for frequency.  Psychiatric/Behavioral: Negative for dysphoric mood. The patient is not nervous/anxious.        Objective:   Physical Exam  Constitutional: He appears well-developed and well-nourished. No distress.       obese  Eyes: No scleral icterus.  Neck: Neck supple. No thyromegaly present.  Cardiovascular: Normal rate, regular rhythm and normal heart sounds.   No murmur heard. Pulmonary/Chest: Effort  normal and breath sounds normal. No respiratory distress. He has no wheezes.  Musculoskeletal: He exhibits no edema.       Moderate nontender varicose veins both legs  Lymphadenopathy:    He has no cervical adenopathy.  Skin: Skin is warm and dry.  Psychiatric: He has a normal mood and affect.          Assessment & Plan:

## 2011-05-07 NOTE — Patient Instructions (Signed)
Go up on Levemir to 31 units once daily.  Work on low sugar/ low carb diet with regular exercise.  45+ min 4-5 days/ wk.  Check labs today. If testosterone is low, start once a month testosterone shots here.  BP looks great.  Stay on Lisinopril.  Change plain metformin to Kombliglyze 1 tab ONCE a day with dinner.  Return for f/u visit -- recheck urine microalbumin and renal ultrasound in 2 mos.

## 2011-05-07 NOTE — Assessment & Plan Note (Signed)
Repeat renal u/s at f/u appt in 2 mos.

## 2011-05-08 ENCOUNTER — Telehealth: Payer: Self-pay | Admitting: Family Medicine

## 2011-05-08 LAB — BASIC METABOLIC PANEL WITH GFR
BUN: 22 mg/dL (ref 6–23)
Chloride: 103 mEq/L (ref 96–112)
GFR, Est African American: >60 mL/min (ref >60)
GFR, Est Non African American: >60 mL/min (ref >60)
Potassium: 4.1 mEq/L (ref 3.5–5.3)
Sodium: 139 mEq/L (ref 135–145)

## 2011-05-08 LAB — TESTOSTERONE, FREE, TOTAL, SHBG
Sex Hormone Binding: 29 nmol/L (ref 13–71)
Testosterone, Free: 33.6 pg/mL — ABNORMAL LOW (ref 47.0–244.0)
Testosterone-% Free: 2 % (ref 1.6–2.9)

## 2011-05-08 NOTE — Telephone Encounter (Signed)
Pls let pt know that his testosterone if very low at 165.  I am going to start him on testosterone cypionate injections here 200 mg IM q 2 wks. Recheck Testosterone level in 3 mos.  Chemistry panel came back normal.

## 2011-05-09 NOTE — Telephone Encounter (Signed)
LMOM informing Pt of the above 

## 2011-05-12 ENCOUNTER — Ambulatory Visit (INDEPENDENT_AMBULATORY_CARE_PROVIDER_SITE_OTHER): Payer: BC Managed Care – PPO | Admitting: Family Medicine

## 2011-05-12 DIAGNOSIS — E291 Testicular hypofunction: Secondary | ICD-10-CM

## 2011-05-12 MED ORDER — TESTOSTERONE CYPIONATE 200 MG/ML IM SOLN
200.0000 mg | INTRAMUSCULAR | Status: DC
  Administered 2011-05-12: 200 mg via INTRAMUSCULAR

## 2011-05-26 ENCOUNTER — Ambulatory Visit (INDEPENDENT_AMBULATORY_CARE_PROVIDER_SITE_OTHER): Payer: BC Managed Care – PPO | Admitting: Family Medicine

## 2011-05-26 DIAGNOSIS — E291 Testicular hypofunction: Secondary | ICD-10-CM

## 2011-05-26 MED ORDER — TESTOSTERONE CYPIONATE 200 MG/ML IM SOLN
200.0000 mg | INTRAMUSCULAR | Status: DC
  Administered 2011-05-26 – 2011-06-09 (×2): 200 mg via INTRAMUSCULAR

## 2011-06-02 ENCOUNTER — Other Ambulatory Visit: Payer: Self-pay | Admitting: Family Medicine

## 2011-06-05 ENCOUNTER — Other Ambulatory Visit: Payer: Self-pay | Admitting: Family Medicine

## 2011-06-05 NOTE — Telephone Encounter (Signed)
Pt called and said the pharm told him to call for a new script for his gombiglyze 04/999; however when looking in the system this med was sent on 05-03-11 for # 30/2 refills.   Plan:  Notified the pharm and spoke with the pharmacist, and told him to discontinue the plain metformin and change the script for gombiglyze 04/999 to #90 and no refills per the pt request.  Pharmacist indeed did not have the script. Jordan Newcomer, LPN [facility]

## 2011-06-09 ENCOUNTER — Ambulatory Visit (INDEPENDENT_AMBULATORY_CARE_PROVIDER_SITE_OTHER): Payer: BC Managed Care – PPO | Admitting: Family Medicine

## 2011-06-09 DIAGNOSIS — E291 Testicular hypofunction: Secondary | ICD-10-CM

## 2011-06-17 ENCOUNTER — Telehealth: Payer: Self-pay | Admitting: Family Medicine

## 2011-06-17 NOTE — Telephone Encounter (Signed)
Pt called and is requesting a copay card for combiglyze.  Said copay too expensive.  Was told he could call and ask for it.  There is no generic for this medication.   Plan:  Pt contacted and told to pup the [facility] copay card. Jarvis Newcomer, LPN [facility]

## 2011-06-24 ENCOUNTER — Ambulatory Visit (INDEPENDENT_AMBULATORY_CARE_PROVIDER_SITE_OTHER): Payer: BC Managed Care – PPO | Admitting: Family Medicine

## 2011-06-24 DIAGNOSIS — E291 Testicular hypofunction: Secondary | ICD-10-CM

## 2011-06-24 MED ORDER — TESTOSTERONE CYPIONATE 200 MG/ML IM SOLN
200.0000 mg | Freq: Once | INTRAMUSCULAR | Status: AC
  Administered 2011-06-24: 200 mg via INTRAMUSCULAR

## 2011-06-24 NOTE — Progress Notes (Signed)
  Subjective:    Patient ID: Jordan Marquez, male    DOB: 05-20-1951, 60 y.o.   MRN: [id number]  HPI  Here for test injection.   Review of Systems     Objective:   Physical Exam        Assessment & Plan:

## 2011-07-07 ENCOUNTER — Ambulatory Visit (INDEPENDENT_AMBULATORY_CARE_PROVIDER_SITE_OTHER): Payer: BC Managed Care – PPO | Admitting: Family Medicine

## 2011-07-07 DIAGNOSIS — E291 Testicular hypofunction: Secondary | ICD-10-CM

## 2011-07-07 MED ORDER — TESTOSTERONE CYPIONATE 200 MG/ML IM SOLN
200.0000 mg | INTRAMUSCULAR | Status: DC
  Administered 2011-07-07 – 2011-08-05 (×3): 200 mg via INTRAMUSCULAR

## 2011-07-22 ENCOUNTER — Ambulatory Visit (INDEPENDENT_AMBULATORY_CARE_PROVIDER_SITE_OTHER): Payer: BC Managed Care – PPO | Admitting: Family Medicine

## 2011-07-22 VITALS — BP 128/82 | HR 65

## 2011-07-22 DIAGNOSIS — E291 Testicular hypofunction: Secondary | ICD-10-CM

## 2011-07-22 NOTE — Progress Notes (Signed)
Here for test injection.  

## 2011-08-05 ENCOUNTER — Ambulatory Visit (INDEPENDENT_AMBULATORY_CARE_PROVIDER_SITE_OTHER): Payer: BC Managed Care – PPO | Admitting: Family Medicine

## 2011-08-05 VITALS — BP 122/76 | HR 74

## 2011-08-05 DIAGNOSIS — E291 Testicular hypofunction: Secondary | ICD-10-CM

## 2011-08-05 NOTE — Progress Notes (Signed)
Here for test injection.  

## 2011-08-07 ENCOUNTER — Encounter: Payer: Self-pay | Admitting: Family Medicine

## 2011-08-07 ENCOUNTER — Ambulatory Visit: Payer: BC Managed Care – PPO | Admitting: Family Medicine

## 2011-08-13 ENCOUNTER — Ambulatory Visit (INDEPENDENT_AMBULATORY_CARE_PROVIDER_SITE_OTHER): Payer: BC Managed Care – PPO | Admitting: Family Medicine

## 2011-08-13 ENCOUNTER — Encounter: Payer: Self-pay | Admitting: Family Medicine

## 2011-08-13 VITALS — BP 127/79 | HR 74 | Wt 231.0 lb

## 2011-08-13 DIAGNOSIS — N529 Male erectile dysfunction, unspecified: Secondary | ICD-10-CM

## 2011-08-13 DIAGNOSIS — E785 Hyperlipidemia, unspecified: Secondary | ICD-10-CM

## 2011-08-13 DIAGNOSIS — E119 Type 2 diabetes mellitus without complications: Secondary | ICD-10-CM

## 2011-08-13 LAB — POCT UA - MICROALBUMIN: Microalbumin Ur, POC: 150 mg/dL

## 2011-08-13 LAB — POCT GLYCOSYLATED HEMOGLOBIN (HGB A1C): Hemoglobin A1C: 9.1

## 2011-08-19 ENCOUNTER — Ambulatory Visit (INDEPENDENT_AMBULATORY_CARE_PROVIDER_SITE_OTHER): Payer: BC Managed Care – PPO | Admitting: Family Medicine

## 2011-08-19 DIAGNOSIS — E291 Testicular hypofunction: Secondary | ICD-10-CM

## 2011-08-19 MED ORDER — TESTOSTERONE CYPIONATE 200 MG/ML IM SOLN
200.0000 mg | Freq: Once | INTRAMUSCULAR | Status: AC
  Administered 2011-08-19: 200 mg via INTRAMUSCULAR

## 2011-08-19 NOTE — Progress Notes (Signed)
  Subjective:    Patient ID: Jordan Marquez, male    DOB: 04/05/1951, 60 y.o.   MRN: [id number] Pt here for testosterone injection HPI    Review of Systems     Objective:   Physical Exam        Assessment & Plan:

## 2011-08-26 NOTE — Progress Notes (Signed)
  Subjective:    Patient ID: Jordan Marquez, male    DOB: 1951-07-14, 60 y.o.   MRN: [id number]  HPI Patient has diabetes and admits that his diabetes has not been under great control. He has not been exercising and his HgA1c has been elevated. He is also concern about his sexual dysfunction and it was stressed to him that as hid diabetes gets worse that will also get worse.   Review of Systems  Constitutional: Positive for activity change and fatigue.  Eyes: Negative.   Respiratory: Negative.   Genitourinary:       Erectile dysfunctioning getting worse       Objective:   Physical Exam  Constitutional: He is oriented to person, place, and time. He appears well-nourished. No distress.       Obese WM   HENT:  Head: Normocephalic.  Neck: Normal range of motion. Neck supple.  Cardiovascular: Normal rate, regular rhythm and normal heart sounds.   Pulmonary/Chest: Effort normal and breath sounds normal.  Musculoskeletal: Normal range of motion.  Neurological: He is alert and oriented to person, place, and time.  Skin: Skin is warm and dry. He is not diaphoretic.          Assessment & Plan:  He admits that his wife has basically told him the same things I am stressing to him now.  We talked about making changes to his insulin  Dosing  But without exercise and him making a conscious effort to changes out look is not good.

## 2011-09-09 ENCOUNTER — Ambulatory Visit (INDEPENDENT_AMBULATORY_CARE_PROVIDER_SITE_OTHER): Payer: BC Managed Care – PPO | Admitting: Family Medicine

## 2011-09-09 VITALS — Temp 98.3°F | Ht 67.0 in | Wt 226.0 lb

## 2011-09-09 DIAGNOSIS — R7989 Other specified abnormal findings of blood chemistry: Secondary | ICD-10-CM

## 2011-09-09 DIAGNOSIS — E291 Testicular hypofunction: Secondary | ICD-10-CM

## 2011-09-09 MED ORDER — TESTOSTERONE CYPIONATE 200 MG/ML IM SOLN
200.0000 mg | INTRAMUSCULAR | Status: DC
  Administered 2011-09-09 – 2011-09-23 (×2): 200 mg via INTRAMUSCULAR

## 2011-09-09 NOTE — Progress Notes (Signed)
  Subjective:    Patient ID: Jordan Marquez, male    DOB: 1951-10-16, 60 y.o.   MRN: [id number]  HPI    Review of Systems     Objective:   Physical Exam        Assessment & Plan:  Pt presents today for his bi-weekly testosterone injection 200 mg.  Last injection was done on 08-19-11.  Pt needs to keep with every two week injections.  However, here today and not happy to have to get vital signs before he gets his injection.  Explained to pt that vitals have to be taken with all nurse visits.  Injection given IM LT gluteal maximus without problems.   Jarvis Newcomer, LPN [facility]

## 2011-09-09 NOTE — Patient Instructions (Signed)
Pt instructed to return in two weeks for next testosterone injection. Jarvis Newcomer, LPN [facility]

## 2011-09-23 ENCOUNTER — Ambulatory Visit (INDEPENDENT_AMBULATORY_CARE_PROVIDER_SITE_OTHER): Payer: BC Managed Care – PPO | Admitting: Family Medicine

## 2011-09-23 VITALS — BP 127/78 | HR 61 | Resp 20 | Ht 67.0 in | Wt 227.0 lb

## 2011-09-23 DIAGNOSIS — R7989 Other specified abnormal findings of blood chemistry: Secondary | ICD-10-CM

## 2011-09-23 DIAGNOSIS — E291 Testicular hypofunction: Secondary | ICD-10-CM

## 2011-09-23 NOTE — Progress Notes (Signed)
  Subjective:    Patient ID: Jordan Marquez, male    DOB: 1951-01-18, 60 y.o.   MRN: [id number]  HPI    Review of Systems     Objective:   Physical Exam        Assessment & Plan:  Pt presents to the office today for nurse visit for his testosterone injection 200mg /ml.  Given Lt gluteal maximus IM without difficulty. Jarvis Newcomer, LPN [facility]

## 2011-09-23 NOTE — Patient Instructions (Signed)
Pt instructed to return in two weeks for his next testosterone injection 200 mg/ml. Jarvis Newcomer, LPN [facility]

## 2011-09-25 ENCOUNTER — Other Ambulatory Visit: Payer: Self-pay | Admitting: Family Medicine

## 2011-10-14 ENCOUNTER — Ambulatory Visit (INDEPENDENT_AMBULATORY_CARE_PROVIDER_SITE_OTHER): Payer: BC Managed Care – PPO | Admitting: Family Medicine

## 2011-10-14 VITALS — BP 115/70 | HR 56

## 2011-10-14 DIAGNOSIS — E291 Testicular hypofunction: Secondary | ICD-10-CM

## 2011-10-14 MED ORDER — TESTOSTERONE CYPIONATE 200 MG/ML IM SOLN
200.0000 mg | INTRAMUSCULAR | Status: DC
  Administered 2011-10-14 – 2011-11-11 (×3): 200 mg via INTRAMUSCULAR

## 2011-10-14 NOTE — Progress Notes (Signed)
  Subjective:    Patient ID: Jordan Marquez, male    DOB: [birth year], 60 y.o.   MRN: [id number] Here for testosterone injection HPI    Review of Systems     Objective:   Physical Exam        Assessment & Plan:

## 2011-10-29 ENCOUNTER — Ambulatory Visit (INDEPENDENT_AMBULATORY_CARE_PROVIDER_SITE_OTHER): Payer: BC Managed Care – PPO | Admitting: Family Medicine

## 2011-10-29 DIAGNOSIS — E291 Testicular hypofunction: Secondary | ICD-10-CM

## 2011-10-29 NOTE — Progress Notes (Signed)
  Subjective:    Patient ID: Jordan Marquez, male    DOB: Nov 15, 1951, 60 y.o.   MRN: [id number]  HPI Testosterone inj needed    Review of Systems     Objective:   Physical Exam        Assessment & Plan:

## 2011-11-11 ENCOUNTER — Encounter: Payer: Self-pay | Admitting: Family Medicine

## 2011-11-11 ENCOUNTER — Ambulatory Visit (INDEPENDENT_AMBULATORY_CARE_PROVIDER_SITE_OTHER): Payer: BC Managed Care – PPO | Admitting: Family Medicine

## 2011-11-11 VITALS — BP 104/68 | HR 73 | Ht 68.0 in | Wt 227.0 lb

## 2011-11-11 DIAGNOSIS — E119 Type 2 diabetes mellitus without complications: Secondary | ICD-10-CM

## 2011-11-11 DIAGNOSIS — Z283 Underimmunization status: Secondary | ICD-10-CM

## 2011-11-11 DIAGNOSIS — E291 Testicular hypofunction: Secondary | ICD-10-CM

## 2011-11-11 MED ORDER — INSULIN DETEMIR 100 UNIT/ML ~~LOC~~ SOLN: 45.0000 [IU] | SUBCUTANEOUS | Status: DC

## 2011-11-11 MED ORDER — SAXAGLIPTIN-METFORMIN ER 2.5-1000 MG PO TB24: 1.0000 | ORAL_TABLET | Freq: Two times a day (BID) | ORAL | Status: DC

## 2011-11-11 NOTE — Progress Notes (Signed)
  Subjective:    Patient ID: Jordan Marquez, male    DOB: 08/23/1951, 60 y.o.   MRN: [id number]  HPI Mr. comes here for followup of his diabetes. Reports starting to exercise more than some lifting weights he reports losing about 2 pounds since his last visit however was not encouraging is that he is eating more states that he's been a lot more hunger he admits that when his colleagues at work he he items and food he also will eat those foods well reports eating a cherry try over the fact of the holidays and dosing his blood sugars over 300 he states his daughter did put him a pump to try using Splenda and he was amazed at how low his blood sugar reaction to that. He is also has challenges where his blood sugars have been low the morning 12/28/1934 isn't using heat in the morning he is concerned about taking him off but then notices later in the day his blood sugars up.   Review of Systems  All other systems reviewed and are negative.  .BP 104/68  Pulse 73  Ht 5\' 8"  (1.727 m)  Wt 227 lb (102.967 kg)  BMI 34.52 kg/m2  SpO2 96%    Objective:   Physical Exam  Constitutional: He appears well-developed and well-nourished.       Obese WM  Neck: Normal range of motion. Neck supple.  Cardiovascular: Normal rate, regular rhythm and normal heart sounds.   Neurological: He is alert.  Skin: Skin is warm.  Psychiatric: He has a normal mood and affect. His behavior is normal.          Results for orders placed in visit on 11/11/11  POCT GLYCOSYLATED HEMOGLOBIN (HGB A1C)      Component Value Range   Hemoglobin A1C 11.0      Assessment & Plan:  Somewhat the patient says initially sounds good however in further review his A1c has gone from 8 to 11 Indicating poor compliance with the diet or eating habits. First of all we'll increase his levemir to  42 units and if tolerated after 2 weeks increase that to 45 units We'll increase his Kombliglyze to 2.5 -1000 twice a day Splint on the time going  over with him about dietary habits how he needs to eat 5-6 small meals a day he is having 2 large meals a day he needs to increase his exercising and these are things that he admits that his wife has told him he is going to nutritional counseling they told him the same but explained to him that he has to be proactive and he has to be the one to pack food that is good for him he can eat with his colleagues eating we will see him back in 2 months and then 4 months and 4 months recheck his hemoglobin A1c and hopefully see an improvement. Excessive amount time spent counseling and discussing and going over diabetes and food management #2 health maintenance updated the patient's colonoscopy status and flu vaccine status as well.

## 2011-11-11 NOTE — Patient Instructions (Signed)
As per discussion increase levemir to 35 for 1 week  And then to 40.  Increase oral medication to twice a day.

## 2011-11-24 ENCOUNTER — Emergency Department (INDEPENDENT_AMBULATORY_CARE_PROVIDER_SITE_OTHER)
Admission: EM | Admit: 2011-11-24 | Discharge: 2011-11-24 | Disposition: A | Discharge status: Home / Self Care | Payer: BC Managed Care – PPO | Source: Home / Self Care | Attending: Family Medicine | Admitting: Family Medicine

## 2011-11-24 ENCOUNTER — Encounter: Payer: Self-pay | Admitting: *Deleted

## 2011-11-24 DIAGNOSIS — J209 Acute bronchitis, unspecified: Secondary | ICD-10-CM

## 2011-11-24 MED ORDER — BENZONATATE 200 MG PO CAPS: 200.0000 mg | ORAL_CAPSULE | Freq: Every day | ORAL | Status: AC

## 2011-11-24 MED ORDER — AZITHROMYCIN 250 MG PO TABS: ORAL_TABLET | ORAL | Status: AC

## 2011-11-24 NOTE — ED Notes (Signed)
Pt c/o productive cough and fatigue x 2wks. Denies fever. He has a hx of bronchitis. He has used cough gtts and IBF.

## 2011-11-24 NOTE — ED Provider Notes (Signed)
History     CSN: [id number] Arrival date & time: 11/24/2011 12:08 PM   First MD Initiated Contact with Patient 11/24/11 1225      Chief Complaint  Patient presents with  . Cough  . Fatigue      HPI Comments: Patient complains of approximately 2 week history of persistent cough without sore throat or nasal congestion.  Complains of fatigue but no myalgias.  Cough is now worse at night and generally productive of yellow or brownish sputum.  There has been no pleuritic pain or shortness of breath, but he complains of wheezes.   No recent fever.  He sometimes coughs until he gags.  He has had a flu shot and tetanus shot.  Patient is a 60 y.o. male presenting with cough. The history is provided by the patient.  Cough This is a new problem. The current episode started more than 1 week ago. The problem occurs constantly. The problem has been gradually worsening. The cough is productive of brown sputum. There has been no fever. Associated symptoms include wheezing. Pertinent negatives include no chest pain, no chills, no sweats, no ear congestion, no ear pain, no headaches, no rhinorrhea, no sore throat, no myalgias, no shortness of breath and no eye redness. He has tried cough syrup for the symptoms. The treatment provided no relief. He is not a smoker.    Past Medical History  Diagnosis Date  . Diabetes mellitus 12-09-07  . Hyperlipidemia   . Hypogonadism male   . Obesity   . Accident while engaged in work-related activity     04-07-2010    History reviewed. No pertinent past surgical history.  Family History  Problem Relation Age of Onset  . Heart attack  60    father  . Hypertension      father  . Dementia      mother  . Hypertension Sister   . Hypertension Brother   . Hypertension Sister   . Heart failure Father   . Dementia Mother     History  Substance Use Topics  . Smoking status: Never Smoker   . Smokeless tobacco: Not on file  . Alcohol Use: No      Review  of Systems  Constitutional: Positive for fatigue. Negative for fever and chills.  HENT: Negative.  Negative for ear pain, sore throat and rhinorrhea.   Eyes: Negative for redness.  Respiratory: Positive for cough and wheezing. Negative for chest tightness and shortness of breath.   Cardiovascular: Negative.  Negative for chest pain.  Gastrointestinal: Negative.   Genitourinary: Negative.   Musculoskeletal: Negative.  Negative for myalgias.  Neurological: Negative for headaches.    Allergies  Review of patient's allergies indicates no known allergies.  Home Medications   Current Outpatient Rx  Name Route Sig Dispense Refill  . ASPIRIN 81 MG PO TABS Oral Take 81 mg by mouth daily.      . AZITHROMYCIN 250 MG PO TABS  Take 2 tabs today; then begin one tab once daily for 4 more days.   DEA # [id number] 6 each 0  . BENZONATATE 200 MG PO CAPS Oral Take 1 capsule (200 mg total) by mouth at bedtime. Take as needed for cough 12 capsule 0  . BLOOD GLUCOSE METER KIT Subcutaneous Inject into the skin. Use as instructed     . GLUCOSE BLOOD VI STRP Percutaneous by Percutaneous route. Test blood sugar 6 times daily as directed.     . INSULIN DETEMIR 100 UNIT/ML  [place] SOLN Subcutaneous Inject 45 Units into the skin every morning. 30 mL 3  . INSULIN PEN NEEDLE 31G X 8 MM MISC Subcutaneous Inject into the skin. Use as directed.     Marland Kitchen LISINOPRIL 5 MG PO TABS Oral Take 5 mg by mouth daily.      Marland Kitchen SAXAGLIPTIN-METFORMIN 2.04-999 MG PO TB24 Oral Take 1 tablet by mouth 2 (two) times daily. 180 tablet 3  . SILDENAFIL CITRATE 100 MG PO TABS Oral Take 100 mg by mouth. 1/2----1 tablet by mouth  X1 as needed     . SIMVASTATIN 40 MG PO TABS  TAKE 1 TABLET EVERY DAY 90 tablet 1    BP 113/77  Pulse 57  Temp(Src) 98.1 F (36.7 C) (Oral)  Resp 18  Ht 5\' 7"  (1.702 m)  Wt 225 lb (102.059 kg)  BMI 35.24 kg/m2  SpO2 98%  Physical Exam Nursing notes and Vital Signs reviewed. Appearance:  Patient appears healthy,  stated age, and in no acute distress Eyes:  Pupils are equal, round, and reactive to light and accomodation.  Extraocular movement is intact.  Conjunctivae are not inflamed  Ears:  Canals normal.  Tympanic membranes normal.  Nose:  Mildly congested turbinates.  No sinus tenderness.   Pharynx:  Normal Neck:  Supple.  Slightly tender shotty posterior nodes are palpated bilaterally  Lungs:  Clear to auscultation.  Breath sounds are equal.  Heart:  Regular rate and rhythm without murmurs, rubs, or gallops.  Abdomen:  Nontender without masses or hepatosplenomegaly.  Bowel sounds are present.  No CVA or flank tenderness.  Extremities:  No edema.  No calf tenderness Skin:  No rash present.   ED Course  Procedures  none      1. Acute bronchitis       MDM  Begin Z-pack and Tessalon at bedtime  Take Mucinex (guaifenesin) twice daily for congestion.  Increase fluid intake, rest. May use Afrin nasal spray (or generic oxymetazoline) twice daily for about 5 days.  Also recommend using saline nasal spray several times daily and/or saline nasal irrigation. Stop all antihistamines for now, and other non-prescription cough/cold preparations. Follow-up with family doctor if not improving one week.       Donna Christen, MD 11/24/11 [phone]

## 2011-12-03 ENCOUNTER — Other Ambulatory Visit: Payer: Self-pay | Admitting: *Deleted

## 2011-12-03 MED ORDER — SIMVASTATIN 40 MG PO TABS: 40.0000 mg | ORAL_TABLET | Freq: Every day | ORAL | Status: DC

## 2011-12-16 ENCOUNTER — Ambulatory Visit (INDEPENDENT_AMBULATORY_CARE_PROVIDER_SITE_OTHER): Payer: BC Managed Care – PPO | Admitting: Family Medicine

## 2011-12-16 VITALS — BP 111/71 | HR 73

## 2011-12-16 DIAGNOSIS — E291 Testicular hypofunction: Secondary | ICD-10-CM

## 2011-12-16 MED ORDER — TESTOSTERONE CYPIONATE 200 MG/ML IM SOLN
200.0000 mg | Freq: Once | INTRAMUSCULAR | Status: AC
  Administered 2011-12-16: 200 mg via INTRAMUSCULAR

## 2011-12-16 NOTE — Progress Notes (Signed)
  Subjective:    Patient ID: Jordan Marquez, male    DOB: [birth year], 61 y.o.   MRN: [id number] Testosterone injection HPI    Review of Systems     Objective:   Physical Exam        Assessment & Plan:

## 2011-12-31 ENCOUNTER — Ambulatory Visit (INDEPENDENT_AMBULATORY_CARE_PROVIDER_SITE_OTHER): Payer: BC Managed Care – PPO | Admitting: Family Medicine

## 2011-12-31 DIAGNOSIS — E291 Testicular hypofunction: Secondary | ICD-10-CM

## 2011-12-31 MED ORDER — TESTOSTERONE CYPIONATE 200 MG/ML IM SOLN
200.0000 mg | INTRAMUSCULAR | Status: DC
  Administered 2011-12-31: 200 mg via INTRAMUSCULAR

## 2011-12-31 NOTE — Progress Notes (Signed)
Patient ID: Jordan Marquez, male   DOB: [birth year], 61 y.o.   MRN: [id number] Testosterone inj given

## 2012-01-14 ENCOUNTER — Ambulatory Visit (INDEPENDENT_AMBULATORY_CARE_PROVIDER_SITE_OTHER): Payer: BC Managed Care – PPO | Admitting: Family Medicine

## 2012-01-14 VITALS — BP 134/76 | HR 72

## 2012-01-14 DIAGNOSIS — E291 Testicular hypofunction: Secondary | ICD-10-CM

## 2012-01-14 MED ORDER — TESTOSTERONE CYPIONATE 200 MG/ML IM SOLN
200.0000 mg | Freq: Once | INTRAMUSCULAR | Status: AC
  Administered 2012-01-14: 200 mg via INTRAMUSCULAR

## 2012-01-14 NOTE — Progress Notes (Signed)
  Subjective:    Patient ID: Jordan Marquez, male    DOB: 06/16/1951, 60 y.o.   MRN: [id number]  HPI  Here for test injection.   Review of Systems     Objective:   Physical Exam        Assessment & Plan:   

## 2012-01-27 ENCOUNTER — Ambulatory Visit (INDEPENDENT_AMBULATORY_CARE_PROVIDER_SITE_OTHER): Payer: BC Managed Care – PPO | Admitting: Family Medicine

## 2012-01-27 ENCOUNTER — Encounter: Payer: Self-pay | Admitting: Family Medicine

## 2012-01-27 VITALS — BP 119/74 | HR 67

## 2012-01-27 DIAGNOSIS — E291 Testicular hypofunction: Secondary | ICD-10-CM

## 2012-01-27 MED ORDER — TESTOSTERONE CYPIONATE 200 MG/ML IM SOLN
200.0000 mg | Freq: Once | INTRAMUSCULAR | Status: AC
  Administered 2012-01-27: 200 mg via INTRAMUSCULAR

## 2012-01-27 NOTE — Progress Notes (Signed)
  Subjective:    Patient ID: Jordan Marquez, male    DOB: Aug 10, 1951, 61 y.o.   MRN: [id number] Testosterone injection HPI    Review of Systems     Objective:   Physical Exam        Assessment & Plan:  Not seen by Dr. today nurses visit

## 2012-01-27 NOTE — Patient Instructions (Signed)
none

## 2012-02-10 ENCOUNTER — Ambulatory Visit: Payer: BC Managed Care – PPO | Admitting: Family Medicine

## 2012-02-12 ENCOUNTER — Encounter: Payer: Self-pay | Admitting: Family Medicine

## 2012-02-12 ENCOUNTER — Ambulatory Visit (INDEPENDENT_AMBULATORY_CARE_PROVIDER_SITE_OTHER): Payer: BC Managed Care – PPO | Admitting: Family Medicine

## 2012-02-12 VITALS — BP 118/75 | HR 64 | Ht 67.0 in

## 2012-02-12 DIAGNOSIS — E291 Testicular hypofunction: Secondary | ICD-10-CM

## 2012-02-12 MED ORDER — TESTOSTERONE CYPIONATE 200 MG/ML IM SOLN
200.0000 mg | Freq: Once | INTRAMUSCULAR | Status: AC
  Administered 2012-02-12: 200 mg via INTRAMUSCULAR

## 2012-02-12 NOTE — Patient Instructions (Signed)
none

## 2012-02-12 NOTE — Progress Notes (Signed)
  Subjective:    Patient ID: Jordan Marquez, male    DOB: 02/04/1951, 61 y.o.   MRN: [id number] Testosterone injection HPI    Review of Systems     Objective:   Physical Exam        Assessment & Plan:  Patient not seen by physician today. 

## 2012-02-13 ENCOUNTER — Ambulatory Visit: Payer: BC Managed Care – PPO

## 2012-02-26 ENCOUNTER — Ambulatory Visit (INDEPENDENT_AMBULATORY_CARE_PROVIDER_SITE_OTHER): Payer: BC Managed Care – PPO | Admitting: Family Medicine

## 2012-02-26 VITALS — BP 114/71 | HR 62 | Ht 67.0 in

## 2012-02-26 DIAGNOSIS — E291 Testicular hypofunction: Secondary | ICD-10-CM

## 2012-02-26 MED ORDER — TESTOSTERONE CYPIONATE 200 MG/ML IM SOLN
200.0000 mg | Freq: Once | INTRAMUSCULAR | Status: AC
  Administered 2012-02-26: 200 mg via INTRAMUSCULAR

## 2012-02-26 NOTE — Progress Notes (Signed)
  Subjective:    Patient ID: Jordan Marquez, male    DOB: 06-04-1951, 61 y.o.   MRN: [id number] Testosterone injection HPI    Review of Systems     Objective:   Physical Exam  Not seen by a physician today      Assessment & Plan:

## 2012-02-26 NOTE — Patient Instructions (Signed)
None

## 2012-03-04 ENCOUNTER — Encounter: Payer: Self-pay | Admitting: *Deleted

## 2012-03-04 ENCOUNTER — Emergency Department
Admit: 2012-03-04 | Discharge: 2012-03-04 | Disposition: A | Discharge status: Home / Self Care | Payer: BC Managed Care – PPO | Attending: Family Medicine | Admitting: Family Medicine

## 2012-03-04 ENCOUNTER — Emergency Department
Admission: EM | Admit: 2012-03-04 | Discharge: 2012-03-04 | Disposition: A | Discharge status: Home / Self Care | Payer: BC Managed Care – PPO | Source: Home / Self Care

## 2012-03-04 DIAGNOSIS — R229 Localized swelling, mass and lump, unspecified: Secondary | ICD-10-CM

## 2012-03-04 DIAGNOSIS — J40 Bronchitis, not specified as acute or chronic: Secondary | ICD-10-CM

## 2012-03-04 MED ORDER — AZITHROMYCIN 250 MG PO TABS: ORAL_TABLET | ORAL | Status: AC

## 2012-03-04 NOTE — Discharge Instructions (Signed)

## 2012-03-04 NOTE — ED Provider Notes (Signed)
History     CSN: [id number]  Arrival date & time 03/04/12  4098   First MD Initiated Contact with Patient 03/04/12 1023      Chief Complaint  Patient presents with  . Cough    (Consider location/radiation/quality/duration/timing/severity/associated sxs/prior treatment) Patient is a 61 y.o. male presenting with cough. The history is provided by the patient.  Cough This is a chronic problem. The current episode started more than 1 week ago. The problem occurs constantly. The cough is productive of sputum. There has been no fever. Associated symptoms include rhinorrhea and wheezing. Pertinent negatives include no chest pain, no chills, no sweats, no ear pain, no sore throat and no shortness of breath.  Cough This is a chronic problem. The current episode started more than 1 week ago. The problem occurs constantly. The cough is productive of sputum. Associated symptoms include rhinorrhea and wheezing. Pertinent negatives include no chest pain, chills, ear pain, sore throat, shortness of breath or sweats.    Past Medical History  Diagnosis Date  . Diabetes mellitus 12-09-07  . Hyperlipidemia   . Hypogonadism male   . Obesity   . Accident while engaged in work-related activity     04-07-2010    History reviewed. No pertinent past surgical history.  Family History  Problem Relation Age of Onset  . Heart attack  60    father  . Hypertension      father  . Dementia      mother  . Hypertension Sister   . Hypertension Brother   . Hypertension Sister   . Heart failure Father   . Dementia Mother     History  Substance Use Topics  . Smoking status: Never Smoker   . Smokeless tobacco: Not on file  . Alcohol Use: No      Review of Systems  Constitutional: Negative for chills.  HENT: Positive for rhinorrhea. Negative for ear pain and sore throat.   Respiratory: Positive for cough and wheezing. Negative for shortness of breath.   Cardiovascular: Negative for chest pain.     Allergies  Review of patient's allergies indicates no known allergies.  Home Medications   Current Outpatient Rx  Name Route Sig Dispense Refill  . ASPIRIN 81 MG PO TABS Oral Take 81 mg by mouth daily.      . AZITHROMYCIN 250 MG PO TABS  2 tabs po x1 on day 1, 1 tab daily for 4 days 6 each 0  . BLOOD GLUCOSE METER KIT Subcutaneous Inject into the skin. Use as instructed     . GLUCOSE BLOOD VI STRP Percutaneous by Percutaneous route. Test blood sugar 6 times daily as directed.     . INSULIN DETEMIR 100 UNIT/ML [place] SOLN Subcutaneous Inject 45 Units into the skin every morning. 30 mL 3  . INSULIN PEN NEEDLE 31G X 8 MM MISC Subcutaneous Inject into the skin. Use as directed.     Marland Kitchen LISINOPRIL 5 MG PO TABS Oral Take 5 mg by mouth daily.      Marland Kitchen SAXAGLIPTIN-METFORMIN ER 2.04-999 MG PO TB24 Oral Take 1 tablet by mouth 2 (two) times daily. 180 tablet 3  . SILDENAFIL CITRATE 100 MG PO TABS Oral Take 100 mg by mouth. 1/2----1 tablet by mouth  X1 as needed     . SIMVASTATIN 40 MG PO TABS Oral Take 1 tablet (40 mg total) by mouth at bedtime. 90 tablet 1    BP 132/80  Pulse 76  Temp(Src) 98.6 F (37  C) (Oral)  Resp 16  Ht 5\' 7"  (1.702 m)  Wt 227 lb (102.967 kg)  BMI 35.55 kg/m2  SpO2 97%  Physical Exam  Constitutional:       Alert, obese    HENT:  Head: Normocephalic and atraumatic.       +nasal erythema, rhinorrhea bilaterally, + post oropharyngeal erythema, Large neck girth  Eyes: Conjunctivae are normal. Pupils are equal, round, and reactive to light.  Neck: Normal range of motion. Neck supple.  Cardiovascular: Normal rate and regular rhythm.   Pulmonary/Chest:       Good overall air movement, faint expiratory wheezes diffusely  Abdominal:       Obese, non tender   Neurological: He is alert.  Skin: Skin is warm. No rash noted. No erythema.    ED Course  Procedures (including critical care time)  Labs Reviewed - No data to display No results found.   No diagnosis  found.    MDM  RLL Bronchitis: Will treat with azithromycin. Handout given. Discussed resp red flags. Broached issue of PCP f/u to discussed outpt studies (PFTs +/- sleep study if not previously performed given CXR findings, history and body habitus).

## 2012-03-04 NOTE — ED Notes (Signed)
Patient c/o productive cough x 10 days. Used mucinex, robitussin and tylenol oTC. Low grade temp at times.

## 2012-03-05 NOTE — ED Provider Notes (Addendum)
History     CSN: [id number]  Arrival date & time 03/04/12  6213   First MD Initiated Contact with Patient 03/04/12 1023      Chief Complaint  Patient presents with  . Cough    (Consider location/radiation/quality/duration/timing/severity/associated sxs/prior treatment) HPI  Past Medical History  Diagnosis Date  . Diabetes mellitus 12-09-07  . Hyperlipidemia   . Hypogonadism male   . Obesity   . Accident while engaged in work-related activity     04-07-2010    History reviewed. No pertinent past surgical history.  Family History  Problem Relation Age of Onset  . Heart attack  60    father  . Hypertension      father  . Dementia      mother  . Hypertension Sister   . Hypertension Brother   . Hypertension Sister   . Heart failure Father   . Dementia Mother     History  Substance Use Topics  . Smoking status: Never Smoker   . Smokeless tobacco: Not on file  . Alcohol Use: No      Review of Systems  Allergies  Review of patient's allergies indicates no known allergies.  Home Medications   Current Outpatient Rx  Name Route Sig Dispense Refill  . ASPIRIN 81 MG PO TABS Oral Take 81 mg by mouth daily.      . AZITHROMYCIN 250 MG PO TABS  2 tabs po x1 on day 1, 1 tab daily for 4 days 6 each 0  . BLOOD GLUCOSE METER KIT Subcutaneous Inject into the skin. Use as instructed     . GLUCOSE BLOOD VI STRP Percutaneous by Percutaneous route. Test blood sugar 6 times daily as directed.     . INSULIN DETEMIR 100 UNIT/ML [place] SOLN Subcutaneous Inject 45 Units into the skin every morning. 30 mL 3  . INSULIN PEN NEEDLE 31G X 8 MM MISC Subcutaneous Inject into the skin. Use as directed.     Marland Kitchen LISINOPRIL 5 MG PO TABS Oral Take 5 mg by mouth daily.      Marland Kitchen SAXAGLIPTIN-METFORMIN ER 2.04-999 MG PO TB24 Oral Take 1 tablet by mouth 2 (two) times daily. 180 tablet 3  . SILDENAFIL CITRATE 100 MG PO TABS Oral Take 100 mg by mouth. 1/2----1 tablet by mouth  X1 as needed     .  SIMVASTATIN 40 MG PO TABS Oral Take 1 tablet (40 mg total) by mouth at bedtime. 90 tablet 1    BP 132/80  Pulse 76  Temp(Src) 98.6 F (37 C) (Oral)  Resp 16  Ht 5\' 7"  (1.702 m)  Wt 227 lb (102.967 kg)  BMI 35.55 kg/m2  SpO2 97%  Physical Exam  ED Course  Procedures (including critical care time)  Labs Reviewed - No data to display Dg Chest 2 View  03/04/2012  *RADIOLOGY REPORT*  Clinical Data: Rule out atypical pneumonia versus bronchitis. Cough and congestion for 10 days.  CHEST - 2 VIEW  Comparison: None.  Findings: Midline trachea.  Normal heart size and mediastinal contours. No pleural effusion or pneumothorax.  Lower lobe predominant interstitial thickening is moderate.  No confluent/ lobar airspace disease.  IMPRESSION: Lower lobe predominant interstitial thickening is most consistent with chronic bronchitis.  No typical findings of lobar pneumonia or atypical/mycoplasma pneumonia.  Original Report Authenticated By: Consuello Bossier, M.D.     No diagnosis found.    MDM  Pt seen by Dr. Alvester Morin for bronchitis.  See previous note for  details.  Marlaine Hind, MD 03/05/12 1539  Marlaine Hind, MD 03/05/12 1539

## 2012-03-05 NOTE — ED Provider Notes (Signed)
Patient seen by Dr. Newton.  Adelma Bowdoin H Natalee Tomkiewicz, MD 03/05/12 1540 

## 2012-03-16 ENCOUNTER — Ambulatory Visit: Payer: BC Managed Care – PPO | Admitting: Family Medicine

## 2012-03-16 ENCOUNTER — Encounter: Payer: Self-pay | Admitting: Family Medicine

## 2012-03-16 ENCOUNTER — Ambulatory Visit (INDEPENDENT_AMBULATORY_CARE_PROVIDER_SITE_OTHER): Payer: BC Managed Care – PPO | Admitting: Family Medicine

## 2012-03-16 VITALS — BP 128/80 | HR 73 | Ht 67.0 in | Wt 226.0 lb

## 2012-03-16 DIAGNOSIS — Z283 Underimmunization status: Secondary | ICD-10-CM

## 2012-03-16 DIAGNOSIS — B353 Tinea pedis: Secondary | ICD-10-CM

## 2012-03-16 DIAGNOSIS — E119 Type 2 diabetes mellitus without complications: Secondary | ICD-10-CM

## 2012-03-16 DIAGNOSIS — E291 Testicular hypofunction: Secondary | ICD-10-CM

## 2012-03-16 DIAGNOSIS — E785 Hyperlipidemia, unspecified: Secondary | ICD-10-CM

## 2012-03-16 DIAGNOSIS — I10 Essential (primary) hypertension: Secondary | ICD-10-CM

## 2012-03-16 DIAGNOSIS — Z23 Encounter for immunization: Secondary | ICD-10-CM

## 2012-03-16 MED ORDER — TESTOSTERONE CYPIONATE 200 MG/ML IM SOLN
200.0000 mg | INTRAMUSCULAR | Status: DC
  Administered 2012-03-16: 200 mg via INTRAMUSCULAR

## 2012-03-16 MED ORDER — INSULIN PEN NEEDLE 31G X 8 MM MISC: Status: DC

## 2012-03-16 MED ORDER — INSULIN DETEMIR 100 UNIT/ML ~~LOC~~ SOLN: 45.0000 [IU] | SUBCUTANEOUS | Status: DC

## 2012-03-16 MED ORDER — KETOCONAZOLE 2 % EX CREA: TOPICAL_CREAM | Freq: Two times a day (BID) | CUTANEOUS | Status: DC

## 2012-03-16 NOTE — Progress Notes (Signed)
  Subjective:    Patient ID: Jordan Marquez, male    DOB: Mar 12, 1951, 61 y.o.   MRN: [id number]  HPI Patient reports still not exercising but he knows he should. He had an episode where became lightheaded and almost passed out. His blood sugar was in the low 70s. He ate something and felt better. He felt that it was the small amount of brownie that he ate. He felt that he may have had a rebound. He wants a prescription for his Levemir to get the best price. He recently had a bad case of bronchitis and states the provider at the time suggested he checks to see if he needs a repeat pneumonia vaccine since last one was when he was 49. hypogonadism reports a testosterone injections are doing well.  Review of Systems    BP 128/80  Pulse 73  Ht 5\' 7"  (1.702 m)  Wt 226 lb (102.513 kg)  BMI 35.40 kg/m2  SpO2 96% Objective:   Physical Exam  Constitutional: He is oriented to person, place, and time. He appears well-developed and well-nourished.       White male with truncal obesity  HENT:  Head: Normocephalic.  Neck: Normal range of motion. Neck supple.  Cardiovascular: Normal rate and normal heart sounds.   Pulmonary/Chest: Breath sounds normal. He is in respiratory distress.  Musculoskeletal: Normal range of motion.  Neurological: He is alert and oriented to person, place, and time.  Skin: Skin is warm.   examination of the patient's feet showed marked thickening of the skin consistent with recurrent fungal infections. There was fungal infection involving both the great and small toe of both feet But proprioceptor evaluation was normal with good sensation in both feet. Addendum Patient reports having recurrent fungal infections in his feet since leaving [place].    Assessment & Plan:  #1 diabetes. A1c marker improved from the 11 range to 9.1 but he can do better Discussed length about exercise and trying to lose weight. #2 hypoglycemia. Patient had what sounds like his first severe  hypoglycemic episode discussed the need to be aware of the symptoms recurs and still encouraged to lose weight and exercise.  #3 hyperlipidemia. We'll need to get a lipid panel maintain on Zocor #5 hypertension. Blood pressure under good control at this time.  #6 need for immunization. Pneumovax given today we'll give shingles Vaccination next visit.  #7 hypogonadism. Check testosterone level today

## 2012-03-16 NOTE — Patient Instructions (Signed)
PNEUMONExercise to Stay Healthy Exercise helps you become and stay healthy. EXERCISE IDEAS AND TIPS Choose exercises that:  You enjoy.   Fit into your day.  You do not need to exercise really hard to be healthy. You can do exercises at a slow or medium level and stay healthy. You can:  Stretch before and after working out.   Try yoga, Pilates, or tai chi.   Lift weights.   Walk fast, swim, jog, run, climb stairs, bicycle, dance, or rollerskate.   Take aerobic classes.  Exercises that burn about 150 calories:  Running 1  miles in 15 minutes.   Playing volleyball for 45 to 60 minutes.   Washing and waxing a car for 45 to 60 minutes.   Playing touch football for 45 minutes.   Walking 1  miles in 35 minutes.   Pushing a stroller 1  miles in 30 minutes.   Playing basketball for 30 minutes.   Raking leaves for 30 minutes.   Bicycling 5 miles in 30 minutes.   Walking 2 miles in 30 minutes.   Dancing for 30 minutes.   Shoveling snow for 15 minutes.   Swimming laps for 20 minutes.   Walking up stairs for 15 minutes.   Bicycling 4 miles in 15 minutes.   Gardening for 30 to 45 minutes.   Jumping rope for 15 minutes.   Washing windows or floors for 45 to 60 minutes.  Document Released: 12/27/2010 Document Revised: 11/13/2011 Document Reviewed: 12/27/2010 [facility] Patient Information 2012 [place], [place].Pneumococcal Vaccine, Polyvalent solution for injection What is this medicine? PNEUMOCOCCAL VACCINE, POLYVALENT (NEU mo KOK al vak SEEN, pol ee VEY luhnt) is a vaccine to prevent pneumococcus bacteria infection. These bacteria are a major cause of ear infections, Strep throat infections, and serious pneumonia, meningitis, or blood infections worldwide. These vaccines help the body to produce antibodies (protective substances) that help your body defend against these bacteria. This vaccine is recommended for people 49 years of age and older with health problems. It is  also recommended for all adults over 33 years old. This vaccine will not treat an infection. This medicine may be used for other purposes; ask your health care provider or pharmacist if you have questions. What should I tell my health care provider before I take this medicine? They need to know if you have any of these conditions: -bleeding problems -bone marrow or organ transplant -cancer, Hodgkin's disease -fever -infection -immune system problems -low platelet count in the blood -seizures -an unusual or allergic reaction to pneumococcal vaccine, diphtheria toxoid, other vaccines, latex, other medicines, foods, dyes, or preservatives -pregnant or trying to get pregnant -breast-feeding How should I use this medicine? This vaccine is for injection into a muscle or under the skin. It is given by a health care professional. A copy of Vaccine Information Statements will be given before each vaccination. Read this sheet carefully each time. The sheet may change frequently. Talk to your pediatrician regarding the use of this medicine in children. While this drug may be prescribed for children as young as 82 years of age for selected conditions, precautions do apply. Overdosage: If you think you have taken too much of this medicine contact a poison control center or emergency room at once. NOTE: This medicine is only for you. Do not share this medicine with others. What if I miss a dose? It is important not to miss your dose. Call your doctor or health care professional if you are unable to  keep an appointment. What may interact with this medicine? -medicines for cancer chemotherapy -medicines that suppress your immune function -medicines that treat or prevent blood clots like warfarin, enoxaparin, and dalteparin -steroid medicines like prednisone or cortisone This list may not describe all possible interactions. Give your health care provider a list of all the medicines, herbs, non-prescription  drugs, or dietary supplements you use. Also tell them if you smoke, drink alcohol, or use illegal drugs. Some items may interact with your medicine. What should I watch for while using this medicine? Mild fever and pain should go away in 3 days or less. Report any unusual symptoms to your doctor or health care professional. What side effects may I notice from receiving this medicine? Side effects that you should report to your doctor or health care professional as soon as possible: -allergic reactions like skin rash, itching or hives, swelling of the face, lips, or tongue -breathing problems -confused -fever over 102 degrees F -pain, tingling, numbness in the hands or feet -seizures -unusual bleeding or bruising -unusual muscle weakness Side effects that usually do not require medical attention (report to your doctor or health care professional if they continue or are bothersome): -aches and pains -diarrhea -fever of 102 degrees F or less -headache -irritable -loss of appetite -pain, tender at site where injected -trouble sleeping This list may not describe all possible side effects. Call your doctor for medical advice about side effects. You may report side effects to FDA at 1-800-FDA-1088. Where should I keep my medicine? This does not apply. This vaccine is given in a clinic, pharmacy, doctor's office, or other health care setting and will not be stored at home. NOTE: This sheet is a summary. It may not cover all possible information. If you have questions about this medicine, talk to your doctor, pharmacist, or health care provider.  2012, [employer]vier/[employer]. (06/30/2008 2:32:37 PM)

## 2012-03-17 LAB — COMPLETE METABOLIC PANEL WITH GFR
ALT: 20 U/L (ref 0–53)
CO2: 25 mEq/L (ref 19–32)
Creat: 0.86 mg/dL (ref 0.50–1.35)
GFR, Est African American: >89 mL/min
GFR, Est Non African American: >89 mL/min
Glucose, Bld: 150 mg/dL — ABNORMAL HIGH (ref 70–99)
Total Bilirubin: 1.5 mg/dL — ABNORMAL HIGH (ref 0.3–1.2)

## 2012-03-17 LAB — CBC WITH DIFFERENTIAL/PLATELET
Basophils Relative: 0 % (ref 0–1)
Eosinophils Absolute: 0.2 10*3/uL (ref 0.0–0.7)
Hemoglobin: 16.1 g/dL (ref 13.0–17.0)
Lymphs Abs: 2.2 10*3/uL (ref 0.7–4.0)
MCHC: 32.6 g/dL (ref 30.0–36.0)
Monocytes Relative: 17 % — ABNORMAL HIGH (ref 3–12)
Neutro Abs: 4 10*3/uL (ref 1.7–7.7)
Neutrophils Relative %: 52 % (ref 43–77)
Platelets: 183 10*3/uL (ref 150–400)
RBC: 5.36 MIL/uL (ref 4.22–5.81)

## 2012-03-17 LAB — LIPID PANEL
Cholesterol: 152 mg/dL (ref 0–200)
Total CHOL/HDL Ratio: 3.4 Ratio

## 2012-03-17 LAB — TSH: TSH: 2.644 u[IU]/mL (ref 0.350–4.500)

## 2012-03-17 LAB — TESTOSTERONE, FREE, TOTAL, SHBG
Sex Hormone Binding: 34 nmol/L (ref 13–71)
Testosterone, Free: 39.7 pg/mL — ABNORMAL LOW (ref 47.0–244.0)
Testosterone-% Free: 1.9 % (ref 1.6–2.9)

## 2012-04-01 ENCOUNTER — Ambulatory Visit (INDEPENDENT_AMBULATORY_CARE_PROVIDER_SITE_OTHER): Payer: BC Managed Care – PPO | Admitting: Family Medicine

## 2012-04-01 ENCOUNTER — Encounter: Payer: Self-pay | Admitting: Family Medicine

## 2012-04-01 VITALS — BP 121/74 | HR 60 | Ht 67.0 in

## 2012-04-01 DIAGNOSIS — E291 Testicular hypofunction: Secondary | ICD-10-CM

## 2012-04-01 MED ORDER — TESTOSTERONE CYPIONATE 200 MG/ML IM SOLN
300.0000 mg | Freq: Once | INTRAMUSCULAR | Status: AC
  Administered 2012-04-01: 300 mg via INTRAMUSCULAR

## 2012-04-01 NOTE — Progress Notes (Signed)
  Subjective:    Patient ID: Jordan Marquez, male    DOB: March 28, 1951, 61 y.o.   MRN: [id number] Testosterone injection HPI    Review of Systems     Objective:   Physical Exam  Not seen by a physician today.     Assessment & Plan:

## 2012-04-01 NOTE — Patient Instructions (Signed)
No new instructions 

## 2012-04-23 ENCOUNTER — Ambulatory Visit (INDEPENDENT_AMBULATORY_CARE_PROVIDER_SITE_OTHER): Payer: BC Managed Care – PPO | Admitting: Family Medicine

## 2012-04-23 ENCOUNTER — Telehealth: Payer: Self-pay | Admitting: Family Medicine

## 2012-04-23 VITALS — BP 109/71 | HR 63

## 2012-04-23 DIAGNOSIS — E291 Testicular hypofunction: Secondary | ICD-10-CM

## 2012-04-23 MED ORDER — TESTOSTERONE CYPIONATE 200 MG/ML IM SOLN
300.0000 mg | Freq: Once | INTRAMUSCULAR | Status: AC
  Administered 2012-07-23: 300 mg via INTRAMUSCULAR

## 2012-04-23 NOTE — Telephone Encounter (Signed)
[facility] called and request to have 90 day supply sent in because it will be cheaper through insurance to have a 90 day supply of whatever was sent [facility] to [facility]. They did not state specifically on the voice message what he need to have 90 days supply of. Thanks

## 2012-04-23 NOTE — Telephone Encounter (Signed)
Spoke with pharmacy

## 2012-04-23 NOTE — Progress Notes (Signed)
  Subjective:    Patient ID: Jordan Marquez, male    DOB: Jan 21, 1951, 61 y.o.   MRN: [id number] Testosterone Injection HPI    Review of Systems     Objective:   Physical Exam        Assessment & Plan:

## 2012-05-20 ENCOUNTER — Ambulatory Visit (INDEPENDENT_AMBULATORY_CARE_PROVIDER_SITE_OTHER): Payer: BC Managed Care – PPO | Admitting: Family Medicine

## 2012-05-20 VITALS — BP 117/64 | HR 62

## 2012-05-20 DIAGNOSIS — E291 Testicular hypofunction: Secondary | ICD-10-CM

## 2012-05-20 MED ORDER — TESTOSTERONE CYPIONATE 200 MG/ML IM SOLN
300.0000 mg | Freq: Once | INTRAMUSCULAR | Status: AC
  Administered 2012-05-20: 300 mg via INTRAMUSCULAR

## 2012-05-20 NOTE — Patient Instructions (Addendum)
none

## 2012-05-20 NOTE — Progress Notes (Signed)
  Subjective:    Patient ID: Jordan Marquez, male    DOB: 01/01/1951, 61 y.o.   MRN: [id number] Testosterone injection HPI    Review of Systems     Objective:   Physical Exam        Assessment & Plan:  Patient not seen by physician today. 

## 2012-06-04 ENCOUNTER — Encounter: Payer: Self-pay | Admitting: Physician Assistant

## 2012-06-04 ENCOUNTER — Other Ambulatory Visit: Payer: Self-pay | Admitting: Family Medicine

## 2012-06-04 ENCOUNTER — Ambulatory Visit (INDEPENDENT_AMBULATORY_CARE_PROVIDER_SITE_OTHER): Payer: BC Managed Care – PPO | Admitting: Physician Assistant

## 2012-06-04 VITALS — BP 120/75

## 2012-06-04 DIAGNOSIS — E291 Testicular hypofunction: Secondary | ICD-10-CM

## 2012-06-04 MED ORDER — TESTOSTERONE CYPIONATE 200 MG/ML IM SOLN
300.0000 mg | Freq: Once | INTRAMUSCULAR | Status: AC
  Administered 2012-06-04: 300 mg via INTRAMUSCULAR

## 2012-06-04 NOTE — Progress Notes (Signed)
  Subjective:    Patient ID: Jordan Marquez, male    DOB: September 09, 1951, 61 y.o.   MRN: [id number] Testosterone injection HPI    Review of Systems     Objective:   Physical Exam        Assessment & Plan:  Testosterone injection given. Tandy Gaw PA-C

## 2012-06-04 NOTE — Progress Notes (Signed)
  Subjective:    Patient ID: Jordan Marquez, male    DOB: [birth year], 61 y.o.   MRN: [id number]  HPI    Review of Systems     Objective:   Physical Exam        Assessment & Plan:

## 2012-06-29 ENCOUNTER — Ambulatory Visit (INDEPENDENT_AMBULATORY_CARE_PROVIDER_SITE_OTHER): Payer: BC Managed Care – PPO | Admitting: Family Medicine

## 2012-06-29 ENCOUNTER — Other Ambulatory Visit: Payer: Self-pay | Admitting: *Deleted

## 2012-06-29 ENCOUNTER — Telehealth: Payer: Self-pay | Admitting: *Deleted

## 2012-06-29 VITALS — BP 118/74 | HR 77

## 2012-06-29 DIAGNOSIS — E1165 Type 2 diabetes mellitus with hyperglycemia: Secondary | ICD-10-CM

## 2012-06-29 DIAGNOSIS — E291 Testicular hypofunction: Secondary | ICD-10-CM

## 2012-06-29 DIAGNOSIS — E119 Type 2 diabetes mellitus without complications: Secondary | ICD-10-CM

## 2012-06-29 DIAGNOSIS — E139 Other specified diabetes mellitus without complications: Secondary | ICD-10-CM

## 2012-06-29 MED ORDER — SITAGLIP PHOS-METFORMIN HCL ER 50-500 MG PO TB24: 2.0000 | ORAL_TABLET | Freq: Every day | ORAL | Status: DC

## 2012-06-29 MED ORDER — TESTOSTERONE CYPIONATE 200 MG/ML IM SOLN
300.0000 mg | Freq: Once | INTRAMUSCULAR | Status: AC
  Administered 2012-06-29: 300 mg via INTRAMUSCULAR

## 2012-06-29 MED ORDER — SAXAGLIPTIN-METFORMIN ER 2.5-1000 MG PO TB24: 1.0000 | ORAL_TABLET | Freq: Two times a day (BID) | ORAL | Status: DC

## 2012-06-29 NOTE — Progress Notes (Signed)
  Subjective:    Patient ID: Jordan Marquez, male    DOB: Sep 25, 1951, 61 y.o.   MRN: [id number] Testosterone injection HPI    Review of Systems     Objective:   Physical Exam        Assessment & Plan:  Patient not seen by physician today.

## 2012-06-29 NOTE — Patient Instructions (Signed)
None

## 2012-06-29 NOTE — Telephone Encounter (Signed)
Patient will be switched from Kombyglize to janumet XR 50/1000. Please inform him of 2 things. The Janumet XR is a time release preparation so he will take both tablets at the same time and because it has been more than 3 months since he's been seen he needs to make another appointment to come in. I'll give him 90 no refills.

## 2012-06-29 NOTE — Telephone Encounter (Signed)
Pt states insurance not paying for his Kombyglize 2.5mg /1000mg . Called pharmacy and they said that insurance is not gonna cover this med at all but they will cover the Janumet and Janumet XR. Please advise

## 2012-06-30 NOTE — Telephone Encounter (Signed)
Patient notified of MD instructions. KG LPN 

## 2012-07-23 ENCOUNTER — Ambulatory Visit (INDEPENDENT_AMBULATORY_CARE_PROVIDER_SITE_OTHER): Payer: BC Managed Care – PPO | Admitting: Family Medicine

## 2012-07-23 VITALS — BP 118/70 | HR 70

## 2012-07-23 DIAGNOSIS — E291 Testicular hypofunction: Secondary | ICD-10-CM

## 2012-07-23 NOTE — Progress Notes (Signed)
Testosterone injection 

## 2012-07-29 ENCOUNTER — Ambulatory Visit: Payer: BC Managed Care – PPO

## 2012-08-17 ENCOUNTER — Telehealth: Payer: Self-pay | Admitting: *Deleted

## 2012-08-17 ENCOUNTER — Encounter: Payer: Self-pay | Admitting: Family Medicine

## 2012-08-17 ENCOUNTER — Ambulatory Visit: Payer: BC Managed Care – PPO | Admitting: Family Medicine

## 2012-08-17 ENCOUNTER — Ambulatory Visit (INDEPENDENT_AMBULATORY_CARE_PROVIDER_SITE_OTHER): Payer: BC Managed Care – PPO | Admitting: Family Medicine

## 2012-08-17 VITALS — BP 121/84 | HR 82 | Ht 67.0 in | Wt 220.0 lb

## 2012-08-17 DIAGNOSIS — I8390 Asymptomatic varicose veins of unspecified lower extremity: Secondary | ICD-10-CM

## 2012-08-17 DIAGNOSIS — E119 Type 2 diabetes mellitus without complications: Secondary | ICD-10-CM

## 2012-08-17 DIAGNOSIS — E291 Testicular hypofunction: Secondary | ICD-10-CM

## 2012-08-17 MED ORDER — TESTOSTERONE CYPIONATE 200 MG/ML IM SOLN
300.0000 mg | INTRAMUSCULAR | Status: DC
  Administered 2012-08-17: 300 mg via INTRAMUSCULAR

## 2012-08-17 NOTE — Telephone Encounter (Signed)
Misty I have placed an order for referral for him to be seen with Dr. Gretta Began.

## 2012-08-17 NOTE — Telephone Encounter (Signed)
Wife states that the surgeon Alta needs to be sent to is Dr. Gretta Began in [place].

## 2012-08-17 NOTE — Progress Notes (Signed)
  Subjective:    Patient ID: Jordan Marquez, male    DOB: 1951-03-26, 61 y.o.   MRN: [id number]  HPI Patient's here for her diabetes followup. He admits that he did not follow his diet like he should when he was on vacation this summer at [facility]. He has promised me that he is exercising more walking the golf course much more.   Review of Systems  Constitutional: Positive for activity change.  Neurological:       He also expressed concern that his memory is starting to change and is unable to remember recent things happen and things that he could easily call he has to look them up now  Hematological:       He has noticed increase swelling of his varicose veins on his left lower leg.  All other systems reviewed and are negative.      BP 121/84  Pulse 82  Ht 5\' 7"  (1.702 m)  Wt 220 lb (99.791 kg)  BMI 34.46 kg/m2  SpO2 98% Objective:   Physical Exam  Constitutional: He appears well-developed and well-nourished.  HENT:  Head: Normocephalic.  Cardiovascular: Normal rate, regular rhythm and normal heart sounds.        Left lower leg shows marked vascular prominence and multiple varicosities. However there is an area on the medial side of the left leg with varicosities is thickened and appears to be dilated  Neurological: He is alert.  Skin: Skin is warm and dry.  Psychiatric: He has a normal mood and affect. His behavior is normal.   Lab Results  Component Value Date   HGBA1C 9.4 08/17/2012      Assessment & Plan:  #1 memory loss. Reassured patient that what he describes appears be more simple memory loss Versus dementia since he is aware of the memory loss. Explained that there is really no medication I can prescribe for the improvement but loss but if he wants me to I will send him a length to the  [facility] site where they have some organic and natural supplements that might help her memory loss.  #2 while there has been some weight loss and is exercising more A1c is  unacceptable. I was considering a Victoza or Bydueron agent until further evaluation shows that they have changed him from one of the DPP 4 inhibitors to another and during this change his metformin dose has not been maximized. Will have him take Janumet 50/500 and plain metformin 500 which he still has an abundance of and take 2 of those tablets twice a day. We'll have him return in 3 months instead of 5 months so we can keep a close eye on his diabetes. Also will make this his yearly examination. #3 varicosity. Possible dilatation and aneurism changes. Will refer to vascular surgeon for further evaluation and treatment. Turns out that his wife months the vascular lab at  [facility] and we'll ask him to have her call us with the name of who she would like him to see.

## 2012-08-17 NOTE — Patient Instructions (Signed)
Varicose Vein Surgery Varicose veins are veins that have become enlarged and twisted. Small varicose veins are sometimes called "spider veins." Valves in the veins help move blood from the leg to the heart. If these valves are damaged, blood flows backwards. The blood backs up into the veins in the leg near the skin surface. The veins expand and get larger because of increased pressure against the inside walls of the veins. People who are on their feet for long periods are at higher risk for this problem. It is also commonly seen during pregnancy or in those who are overweight. Varicose vein surgery is done to remove enlarged veins. This helps reduce pain, aching, and the risk of bleeding and blood clots that can form in these veins. Surgery can also improve the cosmetic appearance of the affected area. There are several surgical methods that can be used. Your caregiver will discuss the method that is best for you based on your specific needs. Treatment usually does not mean a hospital stay or a long, uncomfortable recovery. Less invasive techniques most often allow varicose veins to be dealt with on an outpatient basis. LET YOUR CAREGIVER KNOW ABOUT:   Allergies.   Medications taken including herbs, eye drops, prescription medications, over the counter medications, and creams.   Use of steroids (by mouth or creams).   Use of aspirin or blood-thinning medicines.   History of bleeding or blood problems.   Previous problems with anesthetics or Novocaine.   Possibility of pregnancy, if this applies.   History of blood clots (thrombophlebitis).   Previous surgery.   Other health problems.  RISKS AND COMPLICATIONS  Blood clots in deep veins (thrombophlebitis).   Infection.   Ulcer of the skin (breakdown of skin).   Recurrent varicose veins.   If receiving Sclerotherapy (see below), the following are uncommon but possible:   Temporary stinging or painful cramps where the injection was  made.   Temporary red, raised patches of skin where the injection was made.   Temporary small skin sores where the injection was made.   Temporary bruises where the injection was made.   Spots around the treated vein that usually disappear.   Brown lines around the treated vein that usually disappear.   Groups of fine, red blood vessels around the treated vein that usually disappear.   The treated vein can also become inflamed or develop lumps of clotted blood. This is not dangerous.  BEFORE THE PROCEDURE  If you are using any "blood thinner" medicines or medicines for arthritis, they may need to be stopped temporarily before the procedure. Make sure you discuss this with your caregiver before surgery. PROCEDURE Several types of surgery are available for safe and effective treatment of varicose veins:   Sclerotherapy. Your caregiver injects small and medium sized varicose veins with a solution that scars and closes those veins. In a few weeks, treated varicose veins should fade. Some veins may need to be injected more than once. Sclerotherapy is effective if done correctly. Sclerotherapy does not require anesthesia and can be done in your doctor's office.   Laser surgeries. Doctors use lasers on smaller varicose veins. Laser surgery works by sending strong bursts of light onto the vein, which makes the vein slowly fade and disappear. No incisions or needles are used. The whole procedure usually takes less than 45 minutes.   Catheter-assisted procedures. This procedure is usually done for larger varicose veins. In one of these treatments, your caregiver inserts a thin tube (catheter)  into an enlarged vein and heats the tip of the catheter. As the catheter is pulled out, the heat destroys the vein by causing it to collapse and seal shut.   Vein stripping. This involves removing a long vein through small incisions. This is an outpatient procedure for most, but not all people. Removing the vein  does not affect circulation in your leg because veins deeper in the leg take care of the larger volumes of blood.   Ambulatory phlebectomy. Your doctor removes smaller varicose veins through a series of tiny skin punctures. Local anesthesia is used in this outpatient procedure. Scarring is generally minimal.   Endoscopic vein surgery. You might need this operation only in a severe case involving leg ulcers. Your caregiver uses a thin video camera and inserts it into your leg to close varicose veins. The doctor then removes the veins through small incisions.  AFTER THE PROCEDURE   You may be asked to wear a compression stocking for 3 to 5 days. This will insure that the treated vein(s) stays closed.   If you were taking any "blood thinner" medicines or medicines for arthritis, talk to your caregiver about when these can be restarted.  HOME CARE INSTRUCTIONS   You may need to elevate the treated leg for part of the day for the first 2-3 days after treatment   You may need to continue using compression stockings to lower the chance of developing recurrent varicose veins.   Restart regular prescription and non-prescription medicines as per your caregiver's instructions.  SEEK MEDICAL CARE IF:   An oral temperature above 102 F (38.9 C) develops, not controlled by medication.   You notice increasing pain not adequately controlled with medicine prescribed.   You notice pus or oozing of fluid from any incision site 2 or more days after the surgery.  SEEK IMMEDIATE MEDICAL CARE IF:   There is increased pain or swelling in the calf of your leg.   You notice red-streaking starting at an incision site and extending above or below that site.   An oral temperature above 102 F (38.9 C) develops, not controlled by medication.   You have sudden onset of chest pain, shortness of breath, difficulty breathing, or begin coughing up blood.   You develop unexplained abdominal pain.   You have  increased bruising or develop sudden swelling in a joint.  Document Released: 12/14/2007 Document Revised: 11/13/2011 Document Reviewed: 12/14/2007 [facility] Patient Information 2012 [place], [place].Varicose Veins Varicose veins are veins that have become enlarged and twisted. CAUSES This condition is the result of valves in the veins not working properly. Valves in the veins help return blood from the leg to the heart. If these valves are damaged, blood flows backwards and backs up into the veins in the leg near the skin. This causes the veins to become larger. People who are on their feet a lot, who are pregnant, or who are overweight are more likely to develop varicose veins. SYMPTOMS   Bulging, twisted-appearing, bluish veins, most commonly found on the legs.   Leg pain or a feeling of heaviness. These symptoms may be worse at the end of the day.   Leg swelling.   Skin color changes.  DIAGNOSIS  Varicose veins can usually be diagnosed with an exam of your legs by your caregiver. He or she may recommend an ultrasound of your leg veins. TREATMENT  Most varicose veins can be treated at home.However, other treatments are available for people who have persistent  symptoms or who want to treat the cosmetic appearance of the varicose veins. These include:  Laser treatment of very small varicose veins.   Medicine that is shot (injected) into the vein. This medicine hardens the walls of the vein and closes off the vein. This treatment is called sclerotherapy. Afterwards, you may need to wear clothing or bandages that apply pressure.   Surgery.  HOME CARE INSTRUCTIONS   Do not stand or sit in one position for long periods of time. Do not sit with your legs crossed. Rest with your legs raised during the day.   Wear elastic stockings or support hose. Do not wear other tight, encircling garments around the legs, pelvis, or waist.   Walk as much as possible to increase blood flow.   Raise the  foot of your bed at night with 2-inch blocks.   If you get a cut in the skin over the vein and the vein bleeds, lie down with your leg raised and press on it with a clean cloth until the bleeding stops. Then place a bandage (dressing) on the cut. See your caregiver if it continues to bleed or needs stitches.  SEEK MEDICAL CARE IF:   The skin around your ankle starts to break down.   You have pain, redness, tenderness, or hard swelling developing in your leg over a vein.   You are uncomfortable due to leg pain.  Document Released: 09/03/2005 Document Revised: 11/13/2011 Document Reviewed: 01/20/2011 [facility] Patient Information 2012 [place], [place].

## 2012-08-31 ENCOUNTER — Other Ambulatory Visit: Payer: Self-pay

## 2012-08-31 DIAGNOSIS — I83893 Varicose veins of bilateral lower extremities with other complications: Secondary | ICD-10-CM

## 2012-09-13 ENCOUNTER — Ambulatory Visit (INDEPENDENT_AMBULATORY_CARE_PROVIDER_SITE_OTHER): Payer: BC Managed Care – PPO | Admitting: Family Medicine

## 2012-09-13 VITALS — BP 109/71 | HR 70

## 2012-09-13 DIAGNOSIS — E291 Testicular hypofunction: Secondary | ICD-10-CM

## 2012-09-13 MED ORDER — TESTOSTERONE CYPIONATE 200 MG/ML IM SOLN
300.0000 mg | Freq: Once | INTRAMUSCULAR | Status: AC
  Administered 2012-09-13: 300 mg via INTRAMUSCULAR

## 2012-09-13 NOTE — Progress Notes (Signed)
Presents for testosterone injection.

## 2012-09-30 ENCOUNTER — Encounter: Payer: Self-pay | Admitting: Vascular Surgery

## 2012-10-04 ENCOUNTER — Encounter: Payer: Self-pay | Admitting: Vascular Surgery

## 2012-10-05 ENCOUNTER — Encounter: Payer: Self-pay | Admitting: Vascular Surgery

## 2012-10-05 ENCOUNTER — Encounter (INDEPENDENT_AMBULATORY_CARE_PROVIDER_SITE_OTHER): Payer: BC Managed Care – PPO | Admitting: *Deleted

## 2012-10-05 ENCOUNTER — Ambulatory Visit (INDEPENDENT_AMBULATORY_CARE_PROVIDER_SITE_OTHER): Payer: BC Managed Care – PPO | Admitting: Vascular Surgery

## 2012-10-05 VITALS — BP 146/84 | HR 54 | Resp 18 | Ht 67.0 in | Wt 217.6 lb

## 2012-10-05 DIAGNOSIS — I83893 Varicose veins of bilateral lower extremities with other complications: Secondary | ICD-10-CM

## 2012-10-05 DIAGNOSIS — M79609 Pain in unspecified limb: Secondary | ICD-10-CM

## 2012-10-05 DIAGNOSIS — M7989 Other specified soft tissue disorders: Secondary | ICD-10-CM
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
## 2012-10-13 ENCOUNTER — Ambulatory Visit (INDEPENDENT_AMBULATORY_CARE_PROVIDER_SITE_OTHER): Payer: BC Managed Care – PPO | Admitting: Sports Medicine

## 2012-10-13 VITALS — BP 110/71 | HR 62

## 2012-10-13 DIAGNOSIS — E291 Testicular hypofunction: Secondary | ICD-10-CM

## 2012-10-13 MED ORDER — TESTOSTERONE CYPIONATE 200 MG/ML IM SOLN
300.0000 mg | Freq: Once | INTRAMUSCULAR | Status: AC
  Administered 2012-10-13: 300 mg via INTRAMUSCULAR

## 2012-10-13 NOTE — Progress Notes (Signed)
  Subjective:    Patient ID: Jordan Marquez, male    DOB: February 16, 1951, 61 y.o.   MRN: [id number] Testosterone Injection HPI    Review of Systems     Objective:   Physical Exam        Assessment & Plan:  I was present for all necessary parts of this procedure. Ihor Austin. Benjamin Stain, M.D.

## 2012-11-10 ENCOUNTER — Emergency Department
Admission: EM | Admit: 2012-11-10 | Discharge: 2012-11-10 | Disposition: A | Discharge status: Home / Self Care | Payer: Self-pay | Source: Home / Self Care | Attending: Family Medicine | Admitting: Family Medicine

## 2012-11-10 ENCOUNTER — Encounter: Payer: Self-pay | Admitting: *Deleted

## 2012-11-10 DIAGNOSIS — R062 Wheezing: Secondary | ICD-10-CM

## 2012-11-10 DIAGNOSIS — R05 Cough: Secondary | ICD-10-CM

## 2012-11-10 DIAGNOSIS — J069 Acute upper respiratory infection, unspecified: Secondary | ICD-10-CM

## 2012-11-10 MED ORDER — AZITHROMYCIN 250 MG PO TABS: ORAL_TABLET | ORAL | Status: DC
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
## 2012-11-10 NOTE — ED Notes (Signed)
Pt c/o productive cough and chest congestion (green) x 2 wks. Denies fever. He has taken mucinex and ASA.

## 2012-11-15 ENCOUNTER — Ambulatory Visit (INDEPENDENT_AMBULATORY_CARE_PROVIDER_SITE_OTHER): Payer: BC Managed Care – PPO | Admitting: Sports Medicine

## 2012-11-15 ENCOUNTER — Other Ambulatory Visit: Payer: Self-pay

## 2012-11-15 VITALS — BP 112/77 | HR 77 | Wt 219.0 lb

## 2012-11-15 DIAGNOSIS — E1165 Type 2 diabetes mellitus with hyperglycemia: Secondary | ICD-10-CM

## 2012-11-15 DIAGNOSIS — E291 Testicular hypofunction: Secondary | ICD-10-CM

## 2012-11-15 DIAGNOSIS — Z8639 Personal history of other endocrine, nutritional and metabolic disease: Secondary | ICD-10-CM

## 2012-11-15 MED ORDER — SITAGLIP PHOS-METFORMIN HCL ER 50-500 MG PO TB24: 2.0000 | ORAL_TABLET | Freq: Every day | ORAL | Status: DC

## 2012-11-15 MED ORDER — TESTOSTERONE CYPIONATE 200 MG/ML IM SOLN
300.0000 mg | Freq: Once | INTRAMUSCULAR | Status: AC
  Administered 2012-11-15: 300 mg via INTRAMUSCULAR

## 2012-11-15 NOTE — Progress Notes (Signed)
  Subjective:    Patient ID: Jordan Marquez, male    DOB: Mar 05, 1951, 61 y.o.   MRN: [id number]  HPI    Review of Systems     Objective:   Physical Exam        Assessment & Plan:  Testerone 300 mg injection given today  It has been 8 months since his last testosterone levels, we should check this around his next injection. I will place the orders.  I was present for all essential parts of this visit and procedure. Ihor Austin. Benjamin Stain, M.D.

## 2012-11-16 ENCOUNTER — Encounter: Payer: BC Managed Care – PPO | Admitting: Family Medicine

## 2012-11-16 DIAGNOSIS — Z0289 Encounter for other administrative examinations: Secondary | ICD-10-CM

## 2012-12-07 ENCOUNTER — Other Ambulatory Visit: Payer: Self-pay | Admitting: *Deleted

## 2012-12-07 MED ORDER — SIMVASTATIN 40 MG PO TABS: 40.0000 mg | ORAL_TABLET | Freq: Every day | ORAL | Status: DC

## 2012-12-17 ENCOUNTER — Encounter: Payer: Self-pay | Admitting: Family Medicine

## 2012-12-17 ENCOUNTER — Ambulatory Visit (INDEPENDENT_AMBULATORY_CARE_PROVIDER_SITE_OTHER): Payer: BC Managed Care – PPO | Admitting: Family Medicine

## 2012-12-17 VITALS — BP 107/61 | HR 67 | Resp 18 | Wt 217.0 lb

## 2012-12-17 DIAGNOSIS — E291 Testicular hypofunction: Secondary | ICD-10-CM

## 2012-12-17 DIAGNOSIS — E669 Obesity, unspecified: Secondary | ICD-10-CM

## 2012-12-17 DIAGNOSIS — E119 Type 2 diabetes mellitus without complications: Secondary | ICD-10-CM

## 2012-12-17 LAB — POCT UA - MICROALBUMIN
Creatinine, POC: 100 mg/dL
Microalbumin Ur, POC: 80 mg/dL

## 2012-12-17 LAB — POCT GLYCOSYLATED HEMOGLOBIN (HGB A1C): Hemoglobin A1C: 9.6

## 2012-12-17 MED ORDER — TESTOSTERONE CYPIONATE 200 MG/ML IM SOLN
300.0000 mg | Freq: Once | INTRAMUSCULAR | Status: AC
  Administered 2012-12-17: 300 mg via INTRAMUSCULAR

## 2012-12-17 NOTE — Progress Notes (Signed)
  Subjective:    Patient ID: Jordan Marquez, male    DOB: March 19, 1951, 62 y.o.   MRN: [id number]  HPI DM - Admits he didn't do well over the [facility] but has gotten back on track the last 2 weeks.  Says stopped his Levemir about a week ago and his sugars have dropped to under 130.  Says he actually feels better.  No wounds or sores that aren't healing well.  No hypoglycemic events.  Says noticed after he would eat cake over Holidays he would feel bad.  He has cut out concentrated sugars and cut out most carbs.   Lab Results  Component Value Date   HGBA1C 9.4 08/17/2012      Review of Systems     Objective:   Physical Exam  Constitutional: He is oriented to person, place, and time. He appears well-developed and well-nourished.  HENT:  Head: Normocephalic and atraumatic.  Cardiovascular: Normal rate, regular rhythm and normal heart sounds.   Pulmonary/Chest: Effort normal and breath sounds normal.  Abdominal:       + abdominal obesity  Neurological: He is alert and oriented to person, place, and time.  Skin: Skin is warm and dry.  Psychiatric: He has a normal mood and affect. His behavior is normal.          Assessment & Plan:  DM- doing well on low carb diet and low sugar diet.  Will continue current regimen.  He has lost 2lbs since early December.  I think he is committed to making some major changes in his life. I think this is absolutely fantastic and I would like to be able to keep him off of insulin. Continue Janumet for now. Followup in 3 months. Due for CMP.  He is on an ACE inhibitor and a statin and baby aspirin  Obesity - continue to work on diet and exercise.  He is Re: lost 3 pounds. I would like to see him down about another 40 pounds total.  Does need to incorporate some exercise into his routine.  Hypogonadism-he wanted another testosterone injection today. He says it's too. He gets them about once a month. He has not had a PSA in over a year so printed a lab slip for  him to go in today to have it tested.  Says check with his insurance and they won't pay for shingles vaccine.  ENcouraged him to try again this calendar year as coverage can change.

## 2012-12-18 LAB — COMPLETE METABOLIC PANEL WITH GFR
AST: 37 U/L (ref 0–37)
Alkaline Phosphatase: 51 U/L (ref 39–117)
GFR, Est Non African American: >89 mL/min
Glucose, Bld: 100 mg/dL — ABNORMAL HIGH (ref 70–99)
Sodium: 136 mEq/L (ref 135–145)
Total Bilirubin: 1.9 mg/dL — ABNORMAL HIGH (ref 0.3–1.2)
Total Protein: 7.3 g/dL (ref 6.0–8.3)

## 2012-12-18 LAB — PSA, TOTAL AND FREE: PSA, Free Pct: 23 % — ABNORMAL LOW (ref >25)

## 2013-01-11 ENCOUNTER — Ambulatory Visit: Payer: BC Managed Care – PPO | Admitting: Vascular Surgery

## 2013-01-18 ENCOUNTER — Ambulatory Visit (INDEPENDENT_AMBULATORY_CARE_PROVIDER_SITE_OTHER): Payer: BC Managed Care – PPO | Admitting: Family Medicine

## 2013-01-18 DIAGNOSIS — E291 Testicular hypofunction: Secondary | ICD-10-CM

## 2013-01-18 MED ORDER — TESTOSTERONE CYPIONATE 200 MG/ML IM SOLN
300.0000 mg | Freq: Once | INTRAMUSCULAR | Status: AC
  Administered 2013-01-18: 300 mg via INTRAMUSCULAR

## 2013-01-18 NOTE — Progress Notes (Signed)
  Subjective:    Patient ID: Jordan Marquez, male    DOB: 02-16-1951, 62 y.o.   MRN: [id number] Testosterone injection HPI    Review of Systems     Objective:   Physical Exam        Assessment & Plan:

## 2013-01-24 ENCOUNTER — Encounter: Payer: Self-pay | Admitting: Vascular Surgery

## 2013-01-25 ENCOUNTER — Ambulatory Visit (INDEPENDENT_AMBULATORY_CARE_PROVIDER_SITE_OTHER): Payer: BC Managed Care – PPO | Admitting: Vascular Surgery

## 2013-01-25 ENCOUNTER — Encounter: Payer: Self-pay | Admitting: Vascular Surgery

## 2013-01-25 VITALS — BP 120/69 | HR 78 | Resp 18 | Ht 67.0 in | Wt 211.1 lb

## 2013-01-25 DIAGNOSIS — I83893 Varicose veins of bilateral lower extremities with other complications: Secondary | ICD-10-CM

## 2013-01-25 NOTE — Progress Notes (Signed)
Problems with Activities of Daily Living Secondary to Leg Pain  1. Mr. Jordan Marquez job requires 8-12 hours daily(works 6 days weekly)  of prolonged standing and this is very difficult due to leg pain and swelling.   2. Mr. Jordan Marquez states that he has difficulty falling asleep due to leg pain.  3. Mr. Jordan Marquez plays golf and this is very difficult due to leg pain.    4. Mr. Jordan Marquez states that traveling in the car and on airplanes is very difficult due to leg pain.      Failure of  Conservative Therapy:  1. Worn 20-30 mm Hg thigh high compression hose >3 months with no relief of symptoms.  2. Frequently elevates legs-no relief of symptoms  3. Taken Ibuprofen 600 Mg TID with no relief of symptoms.  The patient is here today for continued followup of his severe venous hypertension in his left leg. He has had a very nice resolution of the significant thrombophlebitis in his varicosities in his left medial calf. He does have some significant staining related to this but the large area of thrombus is resolved. He continues to have pain most particularly with prolonged standing and also severe itching over this whole area. He has been extremely compliant with his thigh graduated compression garments and continues to have difficulty despite this. He does stand for prolonged periods of time making this a difficult.  He does have a 2+ dorsalis pedis pulse on the left. I reimaged is a vein with SonoSite does show enlarged refluxing great saphenous vein throughout his left thigh. This does extend into the varicosities.  Feel that he has failed conservative treatment and recommended laser ablation of his left great saphenous vein and stab phlebectomy of multiple painful tributary varicosities. He understands the procedure and the slight risk of DVT. We'll schedule this at his earliest convenience

## 2013-02-08 ENCOUNTER — Other Ambulatory Visit: Payer: Self-pay | Admitting: *Deleted

## 2013-02-08 DIAGNOSIS — I83893 Varicose veins of bilateral lower extremities with other complications: Secondary | ICD-10-CM

## 2013-02-16 ENCOUNTER — Encounter: Payer: Self-pay | Admitting: Vascular Surgery

## 2013-02-17 ENCOUNTER — Ambulatory Visit (INDEPENDENT_AMBULATORY_CARE_PROVIDER_SITE_OTHER): Payer: BC Managed Care – PPO | Admitting: Vascular Surgery

## 2013-02-17 ENCOUNTER — Encounter: Payer: Self-pay | Admitting: Vascular Surgery

## 2013-02-17 VITALS — BP 116/69 | HR 57 | Resp 18 | Ht 67.0 in | Wt 211.0 lb

## 2013-02-17 DIAGNOSIS — I83893 Varicose veins of bilateral lower extremities with other complications: Secondary | ICD-10-CM

## 2013-02-17 HISTORY — PX: ENDOVENOUS ABLATION SAPHENOUS VEIN W/ LASER: SUR449

## 2013-02-17 NOTE — Progress Notes (Signed)
Laser Ablation Procedure      Date: 02/17/2013    MOSES ODOHERTY DOB:01-Jun-1951  Consent signed: Yes  Surgeon:T.F. Early  Procedure: Laser Ablation: left Greater Saphenous Vein  BP 116/69  Pulse 57  Resp 18  Ht 5\' 7"  (1.702 m)  Wt 211 lb (95.709 kg)  BMI 33.04 kg/m2  Start time: 8:30am   End time: 10:00am  Tumescent Anesthesia: 500 cc 0.9% NaCl with 50 cc Lidocaine HCL with 1% Epi and 15 cc 8.4% NaHCO3  Local Anesthesia: 2 cc Lidocaine HCL and NaHCO3 (ratio 2:1)  Continuous Mode: 15 Watts Total [employer] 2620 Joules Total Time2:54     Stab Phlebectomy: 10-20 incisions Sites: Thigh and Calf  Left leg  Patient tolerated procedure well: Yes    Description of Procedure:  After marking the course of the saphenous vein and the secondary varicosities in the standing position, the patient was placed on the operating table in the supine position, and the left leg was prepped and draped in sterile fashion. Local anesthetic was administered, and under ultrasound guidance the saphenous vein was accessed with a micro needle and guide wire; then the micro puncture sheath was placed. A guide wire was inserted to the saphenofemoral junction, followed by a 5 french sheath.  The position of the sheath and then the laser fiber below the junction was confirmed using the ultrasound and visualization of the aiming beam.  Tumescent anesthesia was administered along the course of the saphenous vein using ultrasound guidance. Protective laser glasses were placed on the patient, and the laser was fired at at 15 watt continuous mode.  For a total of 2620 joules.  A steri strip was applied to the puncture site.  The patient was then put into Trendelenburg position.  Local anesthetic was utilized overlying the marked varicosities.  Greater than 10-20 stab wounds were made using the tip of an 11 blade; and using the vein hook,  The phlebectomies were performed using a hemostat to avulse these varicosities.   Adequate hemostasis was achieved, and steri strips were applied to the stab wound.      ABD pads and thigh high compression stockings were applied.  Ace wrap bandages were applied over the phlebectomy sites and at the top of the saphenofemoral junction.  Blood loss was less than 15 cc.  The patient ambulated out of the operating room having tolerated the procedure well.

## 2013-02-18 ENCOUNTER — Ambulatory Visit (INDEPENDENT_AMBULATORY_CARE_PROVIDER_SITE_OTHER): Payer: BC Managed Care – PPO | Admitting: Family Medicine

## 2013-02-18 ENCOUNTER — Telehealth: Payer: Self-pay | Admitting: *Deleted

## 2013-02-18 VITALS — BP 98/70 | HR 75 | Resp 16

## 2013-02-18 DIAGNOSIS — E291 Testicular hypofunction: Secondary | ICD-10-CM

## 2013-02-18 MED ORDER — TESTOSTERONE CYPIONATE 200 MG/ML IM SOLN
300.0000 mg | Freq: Once | INTRAMUSCULAR | Status: AC
  Administered 2013-02-18: 300 mg via INTRAMUSCULAR

## 2013-02-18 NOTE — Telephone Encounter (Signed)
02/18/2013  Time: 8:56 AM   Patient Name: Jordan Marquez  Patient of: T.F. Early  Procedure:Laser Ablation left  greater saphenous vein and stab phlebectomy 10-20 incisions left leg 02-17-2013   Reached patient at home and checked  His status  Yes    Comments/Actions Taken: Spoke to [employer] (patients's wife) and per her request prepared back to work form that states that [name] may return to work on Monday, March 17,2014. Mrs. Mckone will pick up the back to return form from [facility] today.  Ehtan Delfavero states that her husband is having no problems with bleeding/oozing or swelling.  She states he did experience moderate pain in his left inner thigh yesterday which was relieved by Ibuprofen and states he is having no pain today.  Reviewed post procedural instructions with her and reminded her of [name]' follow up appointments on 02-24-2013.       @SIGNATURE @

## 2013-02-18 NOTE — Progress Notes (Signed)
  Subjective:    Patient ID: Jordan Marquez, male    DOB: 1951-01-09, 62 y.o.   MRN: [id number]   Jordan Marquez is here today for a testosterone injection. Denies headaches, shortness of breath, mood changes or chest pain.    HPI    Review of Systems     Objective:   Physical Exam        Assessment & Plan:  Injection well tolerated.

## 2013-02-21 ENCOUNTER — Encounter: Payer: Self-pay | Admitting: Vascular Surgery

## 2013-02-23 ENCOUNTER — Encounter: Payer: Self-pay | Admitting: Vascular Surgery

## 2013-02-24 ENCOUNTER — Ambulatory Visit (INDEPENDENT_AMBULATORY_CARE_PROVIDER_SITE_OTHER): Payer: BC Managed Care – PPO | Admitting: Family Medicine

## 2013-02-24 ENCOUNTER — Ambulatory Visit (INDEPENDENT_AMBULATORY_CARE_PROVIDER_SITE_OTHER): Payer: BC Managed Care – PPO | Admitting: Vascular Surgery

## 2013-02-24 ENCOUNTER — Encounter (INDEPENDENT_AMBULATORY_CARE_PROVIDER_SITE_OTHER): Payer: BC Managed Care – PPO | Admitting: *Deleted

## 2013-02-24 ENCOUNTER — Encounter: Payer: Self-pay | Admitting: Vascular Surgery

## 2013-02-24 ENCOUNTER — Encounter: Payer: Self-pay | Admitting: Family Medicine

## 2013-02-24 VITALS — BP 125/72 | HR 79 | Wt 205.0 lb

## 2013-02-24 VITALS — BP 114/83 | HR 73 | Resp 16 | Ht 67.0 in | Wt 199.0 lb

## 2013-02-24 DIAGNOSIS — E7800 Pure hypercholesterolemia, unspecified: Secondary | ICD-10-CM

## 2013-02-24 DIAGNOSIS — Z6832 Body mass index (BMI) 32.0-32.9, adult: Secondary | ICD-10-CM

## 2013-02-24 DIAGNOSIS — Z48812 Encounter for surgical aftercare following surgery on the circulatory system: Secondary | ICD-10-CM

## 2013-02-24 DIAGNOSIS — I83893 Varicose veins of bilateral lower extremities with other complications: Secondary | ICD-10-CM

## 2013-02-24 DIAGNOSIS — E669 Obesity, unspecified: Secondary | ICD-10-CM

## 2013-02-24 NOTE — Progress Notes (Signed)
  Subjective:    Patient ID: Jordan Marquez, male    DOB: 28-Oct-1951, 62 y.o.   MRN: [id number]  HPI DM - No hypoglycemic events. REgular exercise.  No insulin for 3 weeks. He says he stopped it because her sugars look so good..  Sugars between 108-130.  No wounds that aren't healing well. He is eating a low carb diet.  He recently had vein surgery so had to hold his aspirin for about 2 weeks but he is back on it now.   Hypercholesteremia-taking a statin and tolerating it well without any side effects.  Review of Systems     Objective:   Physical Exam  Constitutional: He is oriented to person, place, and time. He appears well-developed and well-nourished.  HENT:  Head: Normocephalic and atraumatic.  Cardiovascular: Normal rate, regular rhythm and normal heart sounds.   Pulmonary/Chest: Effort normal and breath sounds normal.  Neurological: He is alert and oriented to person, place, and time.  Skin: Skin is warm and dry.  Psychiatric: He has a normal mood and affect. His behavior is normal.          Assessment & Plan:  DM-  Has eye exam scheduled for April. Had to reschedule from a snow day.  On ACE, statin, statin.  Says plans on starting to exercise. Day. F/U in 3 months.  Lab Results  Component Value Date   HGBA1C 7.8 02/24/2013    Obesity - it sounds like she's made some great dietary changes and has started to pick up the exercise that he hasn't been consistent with the exercise. Encouraged him to get into a good routine especially now that the weather is nicer.  If he could drop 30-40 more pounds I think would make a big difference in the control of his blood sugars as well as his cholesterol. I definitely think he is headed in the right direction.  Hyperlipidemia-Due to recheck lipoids. Labs slip given today Lipid Panel     Component Value Date/Time   CHOL 152 03/16/2012 0914   TRIG 100 03/16/2012 0914   HDL 45 03/16/2012 0914   CHOLHDL 3.4 03/16/2012 0914   VLDL 20 03/16/2012  0914   LDLCALC 87 03/16/2012 0914

## 2013-02-24 NOTE — Progress Notes (Signed)
The patient has today for one week followup following laser ablation of his left great saphenous vein and stab phlebectomy of multiple tributary varicosities throughout his calf. He reports some mild discomfort in the area of the ablation just above his knee on the medial aspect but otherwise no discomfort. He has been compliant with his compression garment  Physical exam: Moderate bruising over the medial thigh at the laser ablation site. Less bruising at the phlebectomy sites throughout his calf. The multiple Steri-Strips are intact. He does have 2+ dorsalis pedis pulse.  Venous duplex today shows no evidence of DVT or deep venous injury and closure of his great saphenous vein from the proximal calf to saphenofemoral junction.  Impression and plan: Excellent result laser ablation and multiple stab phlebectomy. The patient will wear his compression garments for one additional week. He will was then switched to as-needed basis. He will see [place] again on an as-needed basis as well

## 2013-03-18 ENCOUNTER — Ambulatory Visit: Payer: BC Managed Care – PPO | Admitting: Family Medicine

## 2013-03-21 ENCOUNTER — Ambulatory Visit (INDEPENDENT_AMBULATORY_CARE_PROVIDER_SITE_OTHER): Payer: BC Managed Care – PPO | Admitting: Physician Assistant

## 2013-03-21 VITALS — BP 97/60 | HR 74 | Wt 201.0 lb

## 2013-03-21 DIAGNOSIS — E291 Testicular hypofunction: Secondary | ICD-10-CM

## 2013-03-21 MED ORDER — TESTOSTERONE CYPIONATE 200 MG/ML IM SOLN
200.0000 mg | Freq: Once | INTRAMUSCULAR | Status: AC
  Administered 2013-03-21: 200 mg via INTRAMUSCULAR

## 2013-03-21 NOTE — Progress Notes (Signed)
Pt denies any SOB or chest pain.Jordan Marquez [place]

## 2013-03-21 NOTE — Progress Notes (Signed)
  Subjective:    Patient ID: Jordan Marquez, male    DOB: 03-02-1951, 62 y.o.   MRN: [id number]  HPI    Review of Systems     Objective:   Physical Exam        Assessment & Plan:  Injection given today. Next injection in 3 weeks. Tandy Gaw PA-C

## 2013-04-02 ENCOUNTER — Other Ambulatory Visit: Payer: Self-pay | Admitting: Physician Assistant

## 2013-04-22 ENCOUNTER — Ambulatory Visit (INDEPENDENT_AMBULATORY_CARE_PROVIDER_SITE_OTHER): Payer: BC Managed Care – PPO | Admitting: Family Medicine

## 2013-04-22 VITALS — BP 105/67 | HR 69

## 2013-04-22 DIAGNOSIS — E291 Testicular hypofunction: Secondary | ICD-10-CM

## 2013-04-22 MED ORDER — TESTOSTERONE CYPIONATE 200 MG/ML IM SOLN
200.0000 mg | Freq: Once | INTRAMUSCULAR | Status: AC
  Administered 2013-04-22: 200 mg via INTRAMUSCULAR

## 2013-04-22 NOTE — Progress Notes (Signed)
  Subjective:    Patient ID: Jordan Marquez, male    DOB: May 14, 1951, 62 y.o.   MRN: [id number] Pt in for testosterone injection.  IM given with no complication. HPI    Review of Systems     Objective:   Physical Exam        Assessment & Plan:

## 2013-04-26 ENCOUNTER — Telehealth: Payer: Self-pay | Admitting: *Deleted

## 2013-04-26 ENCOUNTER — Encounter: Payer: Self-pay | Admitting: Family Medicine

## 2013-04-26 DIAGNOSIS — E291 Testicular hypofunction: Secondary | ICD-10-CM

## 2013-04-26 LAB — BASIC METABOLIC PANEL WITH GFR
Chloride: 102 mEq/L (ref 96–112)
GFR, Est African American: >89 mL/min
GFR, Est Non African American: >89 mL/min
Potassium: 4.3 mEq/L (ref 3.5–5.3)
Sodium: 136 mEq/L (ref 135–145)

## 2013-04-26 LAB — LIPID PANEL
HDL: 44 mg/dL (ref >39)
Total CHOL/HDL Ratio: 2.8 Ratio
Triglycerides: 93 mg/dL (ref <150)

## 2013-04-26 NOTE — Telephone Encounter (Signed)
Lab entered

## 2013-04-27 LAB — TESTOSTERONE, FREE, TOTAL, SHBG
Sex Hormone Binding: 30 nmol/L (ref 13–71)
Testosterone: 1170.27 ng/dL — ABNORMAL HIGH (ref 300–890)

## 2013-04-27 NOTE — Progress Notes (Signed)
Quick Note:  All labs are normal. ______ 

## 2013-05-20 ENCOUNTER — Ambulatory Visit (INDEPENDENT_AMBULATORY_CARE_PROVIDER_SITE_OTHER): Payer: BC Managed Care – PPO | Admitting: Family Medicine

## 2013-05-20 VITALS — BP 98/60 | HR 65

## 2013-05-20 DIAGNOSIS — E291 Testicular hypofunction: Secondary | ICD-10-CM

## 2013-05-20 MED ORDER — TESTOSTERONE CYPIONATE 200 MG/ML IM SOLN
200.0000 mg | Freq: Once | INTRAMUSCULAR | Status: AC
  Administered 2013-05-20: 200 mg via INTRAMUSCULAR

## 2013-05-20 NOTE — Progress Notes (Signed)
No SOB or chest pains.Jordan Marquez

## 2013-05-31 ENCOUNTER — Encounter: Payer: Self-pay | Admitting: Family Medicine

## 2013-05-31 ENCOUNTER — Ambulatory Visit (INDEPENDENT_AMBULATORY_CARE_PROVIDER_SITE_OTHER): Payer: BC Managed Care – PPO | Admitting: Family Medicine

## 2013-05-31 VITALS — BP 114/70 | HR 78 | Wt 198.0 lb

## 2013-05-31 DIAGNOSIS — R32 Unspecified urinary incontinence: Secondary | ICD-10-CM

## 2013-05-31 DIAGNOSIS — R195 Other fecal abnormalities: Secondary | ICD-10-CM

## 2013-05-31 DIAGNOSIS — R319 Hematuria, unspecified: Secondary | ICD-10-CM

## 2013-05-31 DIAGNOSIS — N400 Enlarged prostate without lower urinary tract symptoms: Secondary | ICD-10-CM

## 2013-05-31 DIAGNOSIS — E119 Type 2 diabetes mellitus without complications: Secondary | ICD-10-CM

## 2013-05-31 LAB — POCT URINALYSIS DIPSTICK
Bilirubin, UA: NEGATIVE
Glucose, UA: NEGATIVE
Ketones, UA: 15
Leukocytes, UA: NEGATIVE
pH, UA: 6

## 2013-05-31 MED ORDER — SIMVASTATIN 40 MG PO TABS: ORAL_TABLET | ORAL | Status: DC
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
## 2013-06-02 LAB — URINE CULTURE: Organism ID, Bacteria: NO GROWTH

## 2013-06-16 ENCOUNTER — Ambulatory Visit (INDEPENDENT_AMBULATORY_CARE_PROVIDER_SITE_OTHER): Payer: BC Managed Care – PPO | Admitting: Family Medicine

## 2013-06-16 ENCOUNTER — Other Ambulatory Visit: Payer: Self-pay

## 2013-06-16 DIAGNOSIS — E291 Testicular hypofunction: Secondary | ICD-10-CM

## 2013-06-16 DIAGNOSIS — R319 Hematuria, unspecified: Secondary | ICD-10-CM

## 2013-06-16 LAB — POCT URINALYSIS DIPSTICK
Bilirubin, UA: NEGATIVE
Glucose, UA: NEGATIVE
Leukocytes, UA: NEGATIVE
Nitrite, UA: NEGATIVE
pH, UA: 6

## 2013-06-16 MED ORDER — TESTOSTERONE CYPIONATE 200 MG/ML IM SOLN
200.0000 mg | Freq: Once | INTRAMUSCULAR | Status: AC
  Administered 2013-06-16: 200 mg via INTRAMUSCULAR

## 2013-06-16 NOTE — Progress Notes (Signed)
  Subjective:    Patient ID: Jordan Marquez, male    DOB: August 01, 1951, 62 y.o.   MRN: [id number] Testosterone injection 200mg  given IM dorsogluteal right side. Pt tolerated well without complications. Also repeat UA ran for hematuria. Barry Dienes, LPN  HPI    Review of Systems     Objective:   Physical Exam        Assessment & Plan:  Urinalysis showed trace blood. We'll send for micro-and culture.

## 2013-06-17 LAB — URINALYSIS, MICROSCOPIC ONLY: Squamous Epithelial / LPF: NONE SEEN

## 2013-07-19 ENCOUNTER — Ambulatory Visit (INDEPENDENT_AMBULATORY_CARE_PROVIDER_SITE_OTHER): Payer: BC Managed Care – PPO | Admitting: Family Medicine

## 2013-07-19 ENCOUNTER — Encounter: Payer: Self-pay | Admitting: *Deleted

## 2013-07-19 VITALS — BP 107/71 | HR 75

## 2013-07-19 DIAGNOSIS — E291 Testicular hypofunction: Secondary | ICD-10-CM

## 2013-07-19 MED ORDER — TESTOSTERONE CYPIONATE 200 MG/ML IM SOLN
200.0000 mg | Freq: Once | INTRAMUSCULAR | Status: AC
  Administered 2013-07-19: 200 mg via INTRAMUSCULAR

## 2013-07-19 NOTE — Progress Notes (Signed)
  Subjective:    Patient ID: Jordan Marquez, male    DOB: 30-Dec-1950, 62 y.o.   MRN: [id number] Pt in for testosterone injection.  200mg  given IM.  Donne Anon, CMA HPI    Review of Systems     Objective:   Physical Exam        Assessment & Plan:

## 2013-07-26 ENCOUNTER — Ambulatory Visit: Payer: BC Managed Care – PPO | Admitting: Family Medicine

## 2013-08-25 ENCOUNTER — Encounter: Payer: Self-pay | Admitting: *Deleted

## 2013-08-25 ENCOUNTER — Ambulatory Visit (INDEPENDENT_AMBULATORY_CARE_PROVIDER_SITE_OTHER): Payer: BC Managed Care – PPO | Admitting: Family Medicine

## 2013-08-25 VITALS — BP 121/76 | HR 71 | Wt 196.0 lb

## 2013-08-25 DIAGNOSIS — E291 Testicular hypofunction: Secondary | ICD-10-CM

## 2013-08-25 MED ORDER — TESTOSTERONE CYPIONATE 200 MG/ML IM SOLN
200.0000 mg | INTRAMUSCULAR | Status: DC
  Administered 2013-08-25: 200 mg via INTRAMUSCULAR

## 2013-08-25 NOTE — Progress Notes (Signed)
  Subjective:    Patient ID: Jordan Marquez, male    DOB: 06-Sep-1951, 63 y.o.   MRN: [id number]  HPI    Review of Systems     Objective:   Physical Exam        Assessment & Plan:  Here for testosterone injection. Due for labs before next injection.  Nani Gasser, MD

## 2013-08-25 NOTE — Progress Notes (Signed)
Patient was given 200 mg Testosterone  right ventroglute area there was no complications noted during or after the injection. Rhonda Cunningham,CMA

## 2013-08-29 ENCOUNTER — Ambulatory Visit: Payer: BC Managed Care – PPO | Admitting: Family Medicine

## 2013-08-31 ENCOUNTER — Encounter: Payer: Self-pay | Admitting: Family Medicine

## 2013-08-31 ENCOUNTER — Ambulatory Visit (INDEPENDENT_AMBULATORY_CARE_PROVIDER_SITE_OTHER): Payer: BC Managed Care – PPO | Admitting: Family Medicine

## 2013-08-31 VITALS — BP 120/74 | HR 69 | Wt 199.0 lb

## 2013-08-31 DIAGNOSIS — N400 Enlarged prostate without lower urinary tract symptoms: Secondary | ICD-10-CM

## 2013-08-31 DIAGNOSIS — E119 Type 2 diabetes mellitus without complications: Secondary | ICD-10-CM

## 2013-08-31 DIAGNOSIS — R635 Abnormal weight gain: Secondary | ICD-10-CM

## 2013-08-31 LAB — BASIC METABOLIC PANEL WITH GFR
Chloride: 103 mEq/L (ref 96–112)
GFR, Est Non African American: >89 mL/min
Potassium: 4.3 mEq/L (ref 3.5–5.3)

## 2013-08-31 LAB — POCT GLYCOSYLATED HEMOGLOBIN (HGB A1C): Hemoglobin A1C: 6.9

## 2013-08-31 MED ORDER — TAMSULOSIN HCL 0.4 MG PO CAPS: 0.4000 mg | ORAL_CAPSULE | Freq: Every day | ORAL | Status: DC

## 2013-08-31 NOTE — Progress Notes (Signed)
Quick Note:  All labs are normal. ______ 

## 2013-08-31 NOTE — Progress Notes (Signed)
  Subjective:    Patient ID: Jordan Marquez, male    DOB: July 25, 1951, 62 y.o.   MRN: [id number]  HPI DM - He is now working 2nd shift for a[employer]ut a month and has had to adjust his medications and having a hard time with this.  Sugars have been running a little highter. Says weight has been going up and down.    BPH - AUA score of 18 at last OV. We discussed trying a treatment. He wanted to think about it at that time. He still having problems initiating urination. Sometimes he'll have to stop and start to completely empty his bladder. He has never taken anything for his prostate before.   Review of Systems     Objective:   Physical Exam  Constitutional: He is oriented to person, place, and time. He appears well-developed and well-nourished.  HENT:  Head: Normocephalic and atraumatic.  Cardiovascular: Normal rate, regular rhythm and normal heart sounds.   Pulmonary/Chest: Effort normal and breath sounds normal.  Neurological: He is alert and oriented to person, place, and time.  Skin: Skin is warm and dry.  Psychiatric: He has a normal mood and affect. His behavior is normal.          Assessment & Plan:  DM- Well controlled.  A1C is 6.8 today.  On statin, ACE, ASA. F/U in 3 months. Diabetic foot exam performed today.  Weight gain- I did discuss with him that rapid fluctuations in weight are typically related to fluid retention. He denies any swelling of the lower extremities. We did discuss the importance of low-salt diet to help control this. He says he has been eating large amounts of salted nuts. Encouraged him to try to get a salt free nuts and see if he notices a difference over the next few weeks.  BPH - Will start flomax. Will monitor BP. Discussed potential side effects of the medication including hypotension and dizziness. F/U in 3 months.

## 2013-09-15 ENCOUNTER — Telehealth: Payer: Self-pay | Admitting: *Deleted

## 2013-09-15 ENCOUNTER — Encounter: Payer: Self-pay | Admitting: *Deleted

## 2013-09-15 ENCOUNTER — Ambulatory Visit (INDEPENDENT_AMBULATORY_CARE_PROVIDER_SITE_OTHER): Payer: BC Managed Care – PPO | Admitting: Family Medicine

## 2013-09-15 VITALS — BP 105/65 | HR 60

## 2013-09-15 DIAGNOSIS — E291 Testicular hypofunction: Secondary | ICD-10-CM

## 2013-09-15 MED ORDER — TESTOSTERONE CYPIONATE 200 MG/ML IM SOLN
200.0000 mg | Freq: Once | INTRAMUSCULAR | Status: AC
  Administered 2013-09-15: 200 mg via INTRAMUSCULAR

## 2013-09-15 NOTE — Telephone Encounter (Signed)
Testosterone labs ordered. 

## 2013-09-15 NOTE — Progress Notes (Signed)
  Subjective:    Patient ID: Jordan Marquez, male    DOB: 25-Apr-1951, 62 y.o.   MRN: [id number] Testosterone injection given IM in [facility].  200mg .  No complications. Donne Anon, CMA HPI    Review of Systems     Objective:   Physical Exam        Assessment & Plan:

## 2013-09-16 LAB — TESTOSTERONE, FREE, TOTAL, SHBG
Sex Hormone Binding: 40 nmol/L (ref 13–71)
Testosterone, Free: 30.9 pg/mL — ABNORMAL LOW (ref 47.0–244.0)
Testosterone-% Free: 1.7 % (ref 1.6–2.9)
Testosterone: 184 ng/dL — ABNORMAL LOW (ref 300–890)

## 2013-09-21 ENCOUNTER — Encounter: Payer: Self-pay | Admitting: Emergency Medicine

## 2013-09-21 ENCOUNTER — Emergency Department (INDEPENDENT_AMBULATORY_CARE_PROVIDER_SITE_OTHER): Payer: BC Managed Care – PPO

## 2013-09-21 ENCOUNTER — Emergency Department
Admission: EM | Admit: 2013-09-21 | Discharge: 2013-09-21 | Disposition: A | Discharge status: Home / Self Care | Payer: BC Managed Care – PPO | Source: Home / Self Care | Attending: Family Medicine | Admitting: Family Medicine

## 2013-09-21 ENCOUNTER — Emergency Department: Payer: BC Managed Care – PPO

## 2013-09-21 ENCOUNTER — Institutional Professional Consult (permissible substitution): Payer: BC Managed Care – PPO | Admitting: Sports Medicine

## 2013-09-21 DIAGNOSIS — M25539 Pain in unspecified wrist: Secondary | ICD-10-CM

## 2013-09-21 DIAGNOSIS — M25532 Pain in left wrist: Secondary | ICD-10-CM
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
## 2013-09-21 NOTE — ED Notes (Signed)
Pt c/o LT hand/wrist pain x 5-6 wks. Denies injury. He has taken IBF and applied bengay.

## 2013-09-22 ENCOUNTER — Institutional Professional Consult (permissible substitution): Payer: BC Managed Care – PPO | Admitting: Sports Medicine

## 2013-10-09 ENCOUNTER — Encounter: Payer: Self-pay | Admitting: Emergency Medicine

## 2013-10-09 ENCOUNTER — Emergency Department
Admission: EM | Admit: 2013-10-09 | Discharge: 2013-10-09 | Disposition: A | Discharge status: Home / Self Care | Payer: BC Managed Care – PPO | Source: Home / Self Care | Attending: Family Medicine | Admitting: Family Medicine

## 2013-10-09 ENCOUNTER — Emergency Department (INDEPENDENT_AMBULATORY_CARE_PROVIDER_SITE_OTHER): Payer: BC Managed Care – PPO

## 2013-10-09 DIAGNOSIS — S161XXA Strain of muscle, fascia and tendon at neck level, initial encounter: Secondary | ICD-10-CM

## 2013-10-09 DIAGNOSIS — M542 Cervicalgia: Secondary | ICD-10-CM

## 2013-10-09 DIAGNOSIS — S46911A Strain of unspecified muscle, fascia and tendon at shoulder and upper arm level, right arm, initial encounter: Secondary | ICD-10-CM

## 2013-10-09 DIAGNOSIS — S139XXA Sprain of joints and ligaments of unspecified parts of neck, initial encounter: Secondary | ICD-10-CM

## 2013-10-09 DIAGNOSIS — IMO0002 Reserved for concepts with insufficient information to code with codable children: Secondary | ICD-10-CM

## 2013-10-09 MED ORDER — CYCLOBENZAPRINE HCL 10 MG PO TABS: 10.0000 mg | ORAL_TABLET | Freq: Three times a day (TID) | ORAL | Status: DC | PRN

## 2013-10-09 MED ORDER — PREDNISONE 50 MG PO TABS: ORAL_TABLET | ORAL | Status: DC

## 2013-10-09 NOTE — ED Notes (Signed)
States he injured his left wrist and was favoring his right side; unsure of what he did but has been having pain to neck down right arm for 1 1/2 week ago.  Pain comes and goes
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
## 2013-10-11 ENCOUNTER — Encounter: Payer: Self-pay | Admitting: *Deleted

## 2013-10-11 ENCOUNTER — Ambulatory Visit (INDEPENDENT_AMBULATORY_CARE_PROVIDER_SITE_OTHER): Payer: BC Managed Care – PPO | Admitting: Family Medicine

## 2013-10-11 VITALS — BP 110/66 | HR 67

## 2013-10-11 DIAGNOSIS — E291 Testicular hypofunction: Secondary | ICD-10-CM

## 2013-10-11 MED ORDER — TESTOSTERONE CYPIONATE 200 MG/ML IM SOLN
200.0000 mg | INTRAMUSCULAR | Status: DC
  Administered 2013-10-11: 200 mg via INTRAMUSCULAR

## 2013-10-11 NOTE — Progress Notes (Signed)
Patient ID: Jordan Marquez, male   DOB: 11/03/1951, 62 y.o.   MRN: [id number]   Patient was given 200 MG testosterone IM LUOQ. There was no signs of reactions. Keiana Tavella,CMA

## 2013-10-13 ENCOUNTER — Other Ambulatory Visit: Payer: Self-pay | Admitting: Family Medicine

## 2013-10-17 ENCOUNTER — Encounter: Payer: Self-pay | Admitting: Sports Medicine

## 2013-10-17 ENCOUNTER — Ambulatory Visit (INDEPENDENT_AMBULATORY_CARE_PROVIDER_SITE_OTHER): Payer: BC Managed Care – PPO | Admitting: Sports Medicine

## 2013-10-17 VITALS — BP 118/77 | HR 88 | Wt 204.0 lb

## 2013-10-17 DIAGNOSIS — M5412 Radiculopathy, cervical region: Secondary | ICD-10-CM

## 2013-10-17 DIAGNOSIS — M25539 Pain in unspecified wrist: Secondary | ICD-10-CM

## 2013-10-17 DIAGNOSIS — M25532 Pain in left wrist: Secondary | ICD-10-CM | POA: Insufficient documentation

## 2013-10-17 MED ORDER — PREDNISONE (PAK) 10 MG PO TABS: ORAL_TABLET | ORAL | Status: DC

## 2013-10-17 NOTE — Assessment & Plan Note (Signed)
Pain has been fairly severe for approximately a month now, without any mechanical symptoms. I do think his pain is originating predominately from the TFCC. We injected this under ultrasound guidance today, he was then placed in a Velcro wrist brace. Like him to do some gentle home rehabilitation, and were predominately on putting at the golf [place]. I like to see him back in one month.

## 2013-10-17 NOTE — Assessment & Plan Note (Signed)
Formal physical therapy for at least one visit. Extended course of prednisone and taper. Home rehabilitation. Return in 4 weeks.

## 2013-10-17 NOTE — Progress Notes (Signed)
   Subjective:    I'm seeing this patient as a consultation for:  Dr. Alvester Morin  CC: Wrist pain  HPI: This is a very pleasant 62 year old male golfer, approximately a month ago he took a swing with the golf club, and felt a sharp pain on the lower aspect of his left wrist, as well as a pain in his neck radiating down both arms. Pain was severe, and he had to stop golfing. He went to urgent care where he was treated conservatively, but unfortunately has not improved. The pain in his wrist is localized just distal to the ulnar styloid process and is worst with ulnar deviation of the wrist. He denies any mechanical symptoms. Neck is worse with turning his head to the left. Pain is moderate to severe, persistent.  Past medical history, Surgical history, Family history not pertinant except as noted below, Social history, Allergies, and medications have been entered into the medical record, reviewed, and no changes needed.   Review of Systems: No headache, visual changes, nausea, vomiting, diarrhea, constipation, dizziness, abdominal pain, skin rash, fevers, chills, night sweats, weight loss, swollen lymph nodes, body aches, joint swelling, muscle aches, chest pain, shortness of breath, mood changes, visual or auditory hallucinations.   Objective:   General: Well Developed, well nourished, and in no acute distress.  Neuro/Psych: Alert and oriented x3, extra-ocular muscles intact, able to move all 4 extremities, sensation grossly intact. Skin: Warm and dry, no rashes noted.  Respiratory: Not using accessory muscles, speaking in full sentences, trachea midline.  Cardiovascular: Pulses palpable, no extremity edema. Abdomen: Does not appear distended. Left Wrist: Inspection normal with no visible erythema or swelling. ROM smooth and normal with good flexion and extension and ulnar/radial deviation that is symmetrical with opposite wrist. Tender to palpation at the TFCC with pain with resisted ulnar  deviation, and passive ulnar deviation of the wrist. No snuffbox tenderness. No tenderness over Canal of Guyon. Strength 5/5 in all directions without pain. Negative Finkelstein, tinel's and phalens. Negative Watson's test. Neck: Inspection unremarkable. No palpable stepoffs. Negative Spurling's maneuver. Full neck range of motion Grip strength and sensation normal in bilateral hands Strength good C4 to T1 distribution No sensory change to C4 to T1 Negative Hoffman sign bilaterally Reflexes normal  Procedure: Real-time Ultrasound Guided Injection of left TFCC Device: GE Logiq E  Verbal informed consent obtained.  Time-out conducted.  Noted no overlying erythema, induration, or other signs of local infection.  Skin prepped in a sterile fashion.  Local anesthesia: Topical Ethyl chloride.  With sterile technique and under real time ultrasound guidance: 25-gauge needle advanced into the space between the ulnar styloid process and the triquetrum, a total of 1 cc Kenalog 40, 3 cc lidocaine injected easily into the wrist joint, and around the TFCC.  Completed without difficulty  Pain immediately resolved suggesting accurate placement of the medication.  Advised to call if fevers/chills, erythema, induration, drainage, or persistent bleeding.  Images permanently stored and available for review in the ultrasound unit.  Impression: Technically successful ultrasound guided injection.  Impression and Recommendations:   This case required medical decision making of moderate complexity.

## 2013-11-10 ENCOUNTER — Encounter: Payer: Self-pay | Admitting: Sports Medicine

## 2013-11-10 ENCOUNTER — Ambulatory Visit (INDEPENDENT_AMBULATORY_CARE_PROVIDER_SITE_OTHER): Payer: BC Managed Care – PPO | Admitting: Sports Medicine

## 2013-11-10 VITALS — BP 134/81 | HR 70

## 2013-11-10 DIAGNOSIS — M25532 Pain in left wrist: Secondary | ICD-10-CM

## 2013-11-10 DIAGNOSIS — M25539 Pain in unspecified wrist: Secondary | ICD-10-CM

## 2013-11-10 DIAGNOSIS — M5412 Radiculopathy, cervical region: Secondary | ICD-10-CM

## 2013-11-10 NOTE — Assessment & Plan Note (Signed)
Resolved with prednisone, muscle relaxers, and home exercises. He did have some high blood sugars and cut the prednisone in half. X-rays were negative.

## 2013-11-10 NOTE — Assessment & Plan Note (Signed)
90% improved after injection around the TFCC. He still has some pain, now localized directly over the extensor carpi ulnaris on top of the ulnar styloid process. Like him to wear a wrist brace, continue exercises for one month, and if continues to have pain we can place an injection into the sixth extensor compartment.

## 2013-11-10 NOTE — Progress Notes (Signed)
  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 At the last visit I injected his triangular fibrocartilaginous complex, he also had some pain in his neck with radicular symptoms which are treated with muscle relaxers, home exercises, and prednisone. He returns with neck pain and radicular symptoms completely resolved, and wrist pain approximately 90% resolved. He has no pain over the TFCC, does have some pain he localizes over the ulnar styloid process in the extensor carpi ulnaris tendon. He is overall very happy with the results and eager to get back into golf.  Past medical history, Surgical history, Family history not pertinant except as noted below, Social history, Allergies, and medications have been entered into the medical record, reviewed, and no changes needed.   Review of Systems: No fevers, chills, night sweats, weight loss, chest pain, or shortness of breath.   Objective:    General: Well Developed, well nourished, and in no acute distress.  Neuro: Alert and oriented x3, extra-ocular muscles intact, sensation grossly intact.  HEENT: Normocephalic, atraumatic, pupils equal round reactive to light, neck supple, no masses, no lymphadenopathy, thyroid nonpalpable.  Skin: Warm and dry, no rashes. Cardiac: Regular rate and rhythm, no murmurs rubs or gallops, no lower extremity edema.  Respiratory: Clear to auscultation bilaterally. Not using accessory muscles, speaking in full sentences. Left Wrist: Inspection normal with no visible erythema or swelling. ROM smooth and normal with good flexion and extension and ulnar/radial deviation that is symmetrical with opposite wrist. No tenderness to palpation over the TFCC, no pain with passive ulnar deviation of the wrist, mild tenderness with palpation over the sixth extensor compartment at the ulnar styloid process but no reproduction of pain with  resisted wrist ulnar deviation. No snuffbox tenderness. No tenderness over Canal of Guyon. Strength 5/5 in all directions without pain. Negative Finkelstein, tinel's and phalens. Negative Watson's test. Neck: Inspection unremarkable. No palpable stepoffs. Negative Spurling's maneuver. Full neck range of motion Grip strength and sensation normal in bilateral hands Strength good C4 to T1 distribution No sensory change to C4 to T1 Negative Hoffman sign bilaterally Reflexes normal  Impression and Recommendations:

## 2013-11-13 ENCOUNTER — Other Ambulatory Visit: Payer: Self-pay | Admitting: Family Medicine

## 2013-11-17 ENCOUNTER — Ambulatory Visit (INDEPENDENT_AMBULATORY_CARE_PROVIDER_SITE_OTHER): Payer: BC Managed Care – PPO | Admitting: Family Medicine

## 2013-11-17 ENCOUNTER — Encounter: Payer: Self-pay | Admitting: *Deleted

## 2013-11-17 VITALS — BP 110/58 | HR 63

## 2013-11-17 DIAGNOSIS — E291 Testicular hypofunction: Secondary | ICD-10-CM

## 2013-11-17 MED ORDER — TESTOSTERONE CYPIONATE 200 MG/ML IM SOLN
200.0000 mg | Freq: Once | INTRAMUSCULAR | Status: AC
  Administered 2013-11-17: 200 mg via INTRAMUSCULAR

## 2013-11-17 NOTE — Progress Notes (Signed)
   Subjective:    Patient ID: Jordan Marquez, male    DOB: 1951/01/17, 62 y.o.   MRN: [id number] Testosterone injection given IM RUOQ. 200mg .  No complications. Donne Anon, CMA HPI    Review of Systems     Objective:   Physical Exam        Assessment & Plan:

## 2013-12-20 ENCOUNTER — Ambulatory Visit (INDEPENDENT_AMBULATORY_CARE_PROVIDER_SITE_OTHER): Payer: BC Managed Care – PPO | Admitting: Family Medicine

## 2013-12-20 ENCOUNTER — Encounter: Payer: Self-pay | Admitting: *Deleted

## 2013-12-20 VITALS — BP 119/69 | HR 66

## 2013-12-20 DIAGNOSIS — E291 Testicular hypofunction: Secondary | ICD-10-CM

## 2013-12-20 MED ORDER — TESTOSTERONE CYPIONATE 200 MG/ML IM SOLN
200.0000 mg | Freq: Once | INTRAMUSCULAR | Status: AC
  Administered 2013-12-20: 200 mg via INTRAMUSCULAR

## 2013-12-20 NOTE — Progress Notes (Signed)
   Subjective:    Patient ID: Jordan BucklesKenneth T Marquez, male    DOB: 11/06/1951, 63 y.o.   MRN: [id number] Testosterone injection given IM LUOQ, 200mg . No complications. Donne AnonAmber Ramadan Couey, CMA HPI    Review of Systems     Objective:   Physical Exam        Assessment & Plan:

## 2013-12-27 ENCOUNTER — Ambulatory Visit (INDEPENDENT_AMBULATORY_CARE_PROVIDER_SITE_OTHER): Payer: BC Managed Care – PPO | Admitting: Sports Medicine

## 2013-12-27 ENCOUNTER — Telehealth: Payer: Self-pay | Admitting: *Deleted

## 2013-12-27 ENCOUNTER — Encounter: Payer: Self-pay | Admitting: Sports Medicine

## 2013-12-27 VITALS — BP 124/76 | HR 74 | Wt 213.0 lb

## 2013-12-27 DIAGNOSIS — M25539 Pain in unspecified wrist: Secondary | ICD-10-CM

## 2013-12-27 DIAGNOSIS — S62153A Displaced fracture of hook process of hamate [unciform] bone, unspecified wrist, initial encounter for closed fracture: Secondary | ICD-10-CM | POA: Insufficient documentation

## 2013-12-27 DIAGNOSIS — M25532 Pain in left wrist: Secondary | ICD-10-CM

## 2013-12-27 DIAGNOSIS — S62143A Displaced fracture of body of hamate [unciform] bone, unspecified wrist, initial encounter for closed fracture: Secondary | ICD-10-CM

## 2013-12-27 NOTE — Assessment & Plan Note (Signed)
Pain at TFCC completely resolved after injection 2 months ago.

## 2013-12-27 NOTE — Telephone Encounter (Signed)
No PA required for CT Hand LT w/o contrast.  Meyer CoryMisty Ahmad, LPN

## 2013-12-27 NOTE — Progress Notes (Signed)
  Subjective:    CC: Followup  HPI: Left wrist pain: Resolved after injection into the TFCC.  Left hand pain: He did not have this pain at previous visits but on further questioning tells me he recalls swinging the golf club and hitting a root. He now recalls pain that he localizes at the hyperthenar eminence, worse with swinging a golf club and worse with palpation. His entire hand feels somewhat tight. Pain is moderate, persistent and localized without radiation.  Past medical history, Surgical history, Family history not pertinant except as noted below, Social history, Allergies, and medications have been entered into the medical record, reviewed, and no changes needed.   Review of Systems: No fevers, chills, night sweats, weight loss, chest pain, or shortness of breath.   Objective:    General: Well Developed, well nourished, and in no acute distress.  Neuro: Alert and oriented x3, extra-ocular muscles intact, sensation grossly intact.  HEENT: Normocephalic, atraumatic, pupils equal round reactive to light, neck supple, no masses, no lymphadenopathy, thyroid nonpalpable.  Skin: Warm and dry, no rashes. Cardiac: Regular rate and rhythm, no murmurs rubs or gallops, no lower extremity edema.  Respiratory: Clear to auscultation bilaterally. Not using accessory muscles, speaking in full sentences. Left hand: tender to palpation over the Hamby, there is reproduction of pain with resisted flexion of the fourth and fifth fingers.  Impression and Recommendations:

## 2013-12-27 NOTE — Assessment & Plan Note (Addendum)
At this point we are going to proceed with CT scan of the left hand. I do have concern for a fracture to the hamate. On further questioning he does tell me some time ago he was taking a swing, and hit a root with his golf club. He has been through one month of immobilization. Pain is not improved so if there is a fracture and will likely need excision.  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 This will likely need excision, we will discuss this further at his visit to go over the CT scan.  I billed a fracture code for this visit, all subsequent visits for this complaint will be "post-op checks" in the global period.

## 2013-12-28 ENCOUNTER — Other Ambulatory Visit: Payer: BC Managed Care – PPO

## 2013-12-29 ENCOUNTER — Other Ambulatory Visit: Payer: Self-pay | Admitting: Family Medicine

## 2013-12-29 ENCOUNTER — Ambulatory Visit (INDEPENDENT_AMBULATORY_CARE_PROVIDER_SITE_OTHER): Payer: BC Managed Care – PPO

## 2013-12-29 DIAGNOSIS — IMO0002 Reserved for concepts with insufficient information to code with codable children: Secondary | ICD-10-CM

## 2013-12-29 DIAGNOSIS — S62153A Displaced fracture of hook process of hamate [unciform] bone, unspecified wrist, initial encounter for closed fracture: Secondary | ICD-10-CM

## 2013-12-30 ENCOUNTER — Ambulatory Visit (INDEPENDENT_AMBULATORY_CARE_PROVIDER_SITE_OTHER): Payer: BC Managed Care – PPO | Admitting: Sports Medicine

## 2013-12-30 ENCOUNTER — Encounter: Payer: Self-pay | Admitting: Sports Medicine

## 2013-12-30 VITALS — BP 118/78 | HR 68 | Wt 213.0 lb

## 2013-12-30 DIAGNOSIS — M25532 Pain in left wrist: Secondary | ICD-10-CM

## 2013-12-30 DIAGNOSIS — S62153A Displaced fracture of hook process of hamate [unciform] bone, unspecified wrist, initial encounter for closed fracture: Secondary | ICD-10-CM

## 2013-12-30 DIAGNOSIS — M25539 Pain in unspecified wrist: Secondary | ICD-10-CM

## 2013-12-30 DIAGNOSIS — S62143A Displaced fracture of body of hamate [unciform] bone, unspecified wrist, initial encounter for closed fracture: Secondary | ICD-10-CM

## 2013-12-30 NOTE — Assessment & Plan Note (Signed)
Continues to be resolved after TFCC injection.

## 2013-12-30 NOTE — Progress Notes (Signed)
  Subjective: Continue pain over the hyperthenar eminence, initially we suspected nonunion of the hook of the hamate as he is a Teacher, English as a foreign languagegolfer.   CT scan does confirm old corticated fracture of the hook of the hamate as suspected. Pain continues.  Pain at the TFCC has resolved after injection.   Objective: General: Well-developed, well-nourished, and in no acute distress. Tender palpation over the hook of the hamate.  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  Assessment/plan:

## 2013-12-30 NOTE — Assessment & Plan Note (Signed)
At this point we are going to refer Jordan Marquez to hand surgery for consideration of excision of the nonunion.

## 2014-01-09 ENCOUNTER — Encounter: Payer: Self-pay | Admitting: Family Medicine

## 2014-01-09 ENCOUNTER — Ambulatory Visit (INDEPENDENT_AMBULATORY_CARE_PROVIDER_SITE_OTHER): Payer: BC Managed Care – PPO | Admitting: Family Medicine

## 2014-01-09 VITALS — BP 111/64 | HR 63 | Temp 97.9°F | Ht 67.0 in | Wt 209.0 lb

## 2014-01-09 DIAGNOSIS — E785 Hyperlipidemia, unspecified: Secondary | ICD-10-CM

## 2014-01-09 DIAGNOSIS — E119 Type 2 diabetes mellitus without complications: Secondary | ICD-10-CM

## 2014-01-09 DIAGNOSIS — E291 Testicular hypofunction: Secondary | ICD-10-CM

## 2014-01-09 LAB — POCT UA - MICROALBUMIN
CREATININE, POC: 200 mg/dL
Microalbumin Ur, POC: 80 mg/L

## 2014-01-09 LAB — POCT GLYCOSYLATED HEMOGLOBIN (HGB A1C): Hemoglobin A1C: 7.9

## 2014-01-09 MED ORDER — LISINOPRIL 5 MG PO TABS
5.0000 mg | ORAL_TABLET | Freq: Every day | ORAL | Status: DC
Start: 2014-01-09 — End: 2015-05-14

## 2014-01-09 NOTE — Progress Notes (Signed)
   Subjective:    Patient ID: Jordan Marquez, male    DOB: 01/06/1951, 63 y.o.   MRN: [id number]  HPI Diabetes w/ hx of microalbuminuria- no hypoglycemic events. No wounds or sores that are not healing well. No increased thirst or urination. Checking glucose at home. Taking medications as prescribed without any side effects.  Hypogonadism - still doing testosterone every 2 weeks. Feels it is helpful.    Has been seeing Dr. Karie Schwalbe for his wrist and then his neck.  Was on steroids for a period and that shot his glucose up. Now seeing hand ortho.  Planning for surgery next month.   Hyperlpidemia - Tolerating statin well w/ no myalgias or S.E.   Review of Systems     Objective:   Physical Exam  Constitutional: He is oriented to person, place, and time. He appears well-developed and well-nourished.  HENT:  Head: Normocephalic and atraumatic.  Cardiovascular: Normal rate, regular rhythm and normal heart sounds.   Pulmonary/Chest: Effort normal and breath sounds normal.  Neurological: He is alert and oriented to person, place, and time.  Skin: Skin is warm and dry.  Psychiatric: He has a normal mood and affect. His behavior is normal.          Assessment & Plan:  DM- uncontrolled.  Usually well-controlled on current regimen that he's been on several courses of prednisone over the last couple of months. Continue work on diet and exercise. I did give him 2 weeks worth of the Janumet 04/999. He had some questions about whether not it would be cheaper for him to switch to glipizide and metformin separately. I encouraged him to use a coupon card which makes a $5 a month in which case it should be even cheaper than splitting of the glipizide and metformin. Plus glipizide to cause weight gain which he does not need as he is Re: gained weight since I last saw him.  On statin, ASA, ACE   Hypogonadism - overall well controlled. Due to repeat PSA and testosterone.    Hyperlipidemia  - continue statin.   Cholesterol is up to day.

## 2014-01-10 LAB — PSA: PSA: 1.45 ng/mL

## 2014-01-11 LAB — TESTOSTERONE, FREE, DIRECT: Free Testosterone, Direct: 1.8 pg/mL — ABNORMAL LOW (ref 3.8–34.2)

## 2014-01-12 ENCOUNTER — Ambulatory Visit (INDEPENDENT_AMBULATORY_CARE_PROVIDER_SITE_OTHER): Payer: BC Managed Care – PPO | Admitting: Sports Medicine

## 2014-01-12 VITALS — BP 130/70 | HR 62 | Wt 209.0 lb

## 2014-01-12 DIAGNOSIS — E291 Testicular hypofunction: Secondary | ICD-10-CM

## 2014-01-12 MED ORDER — GLUCOSE BLOOD VI STRP: ORAL_STRIP | Status: DC

## 2014-01-12 MED ORDER — TESTOSTERONE CYPIONATE 200 MG/ML IM SOLN
200.0000 mg | Freq: Once | INTRAMUSCULAR | Status: AC
  Administered 2014-01-12: 200 mg via INTRAMUSCULAR

## 2014-01-12 NOTE — Progress Notes (Signed)
Pt here for testosterone injection. No sob,cp pt tolerated injection well given in RUOQ. Pt to RTC 3 weeks for next injection.Jordan Marquez, Jordan Marquez

## 2014-01-12 NOTE — Assessment & Plan Note (Signed)
Testosterone injection as above. 

## 2014-02-02 ENCOUNTER — Ambulatory Visit: Payer: BC Managed Care – PPO

## 2014-02-07 ENCOUNTER — Emergency Department (INDEPENDENT_AMBULATORY_CARE_PROVIDER_SITE_OTHER)
Admission: EM | Admit: 2014-02-07 | Discharge: 2014-02-07 | Disposition: A | Discharge status: Home / Self Care | Payer: BC Managed Care – PPO | Source: Home / Self Care | Attending: Family Medicine | Admitting: Family Medicine

## 2014-02-07 ENCOUNTER — Encounter: Payer: Self-pay | Admitting: Emergency Medicine

## 2014-02-07 DIAGNOSIS — J209 Acute bronchitis, unspecified: Secondary | ICD-10-CM

## 2014-02-07 DIAGNOSIS — R509 Fever, unspecified: Secondary | ICD-10-CM

## 2014-02-07 LAB — POCT INFLUENZA A/B
Influenza A, POC: NEGATIVE
Influenza B, POC: NEGATIVE

## 2014-02-07 MED ORDER — BENZONATATE 200 MG PO CAPS: 200.0000 mg | ORAL_CAPSULE | Freq: Every day | ORAL | Status: DC

## 2014-02-07 MED ORDER — AZITHROMYCIN 250 MG PO TABS: ORAL_TABLET | ORAL | Status: DC

## 2014-02-07 NOTE — Discharge Instructions (Signed)
Take plain Mucinex (1200 mg guaifenesin) twice daily for cough and congestion.  Increase fluid intake, rest. If sinus congestion develops, recommend using saline nasal spray several times daily and saline nasal irrigation (AYR is a common brand)  Stop all antihistamines for now, and other non-prescription cough/cold preparations. May take Tylenol for fever, body aches, etc.

## 2014-02-07 NOTE — ED Notes (Signed)
Pt c/o dry cough and fatigue x 1wk, with fever x 2 days. He reports that he is caring for his parents who have been dx with flu and bronchitis.
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
## 2014-02-16 ENCOUNTER — Encounter: Payer: Self-pay | Admitting: *Deleted

## 2014-02-16 ENCOUNTER — Ambulatory Visit (INDEPENDENT_AMBULATORY_CARE_PROVIDER_SITE_OTHER): Payer: BC Managed Care – PPO | Admitting: Family Medicine

## 2014-02-16 VITALS — BP 110/62 | HR 67

## 2014-02-16 DIAGNOSIS — E291 Testicular hypofunction: Secondary | ICD-10-CM

## 2014-02-16 MED ORDER — TESTOSTERONE CYPIONATE 200 MG/ML IM SOLN
200.0000 mg | Freq: Once | INTRAMUSCULAR | Status: AC
  Administered 2014-02-16: 200 mg via INTRAMUSCULAR

## 2014-02-16 NOTE — Progress Notes (Signed)
   Subjective:    Patient ID: Jordan Marquez, male    DOB: 03/05/1951, 63 y.o.   MRN: [id number] Testosterone given IM LUOQ.  No complications. Everest Brod, CMA HPI    Review of Systems     Objective:   Physical Exam        Assessment & Plan:   

## 2014-03-07 ENCOUNTER — Ambulatory Visit: Payer: BC Managed Care – PPO

## 2014-03-07 ENCOUNTER — Ambulatory Visit: Payer: BC Managed Care – PPO | Admitting: *Deleted

## 2014-03-08 ENCOUNTER — Ambulatory Visit (INDEPENDENT_AMBULATORY_CARE_PROVIDER_SITE_OTHER): Payer: BC Managed Care – PPO | Admitting: Family Medicine

## 2014-03-08 ENCOUNTER — Encounter: Payer: Self-pay | Admitting: *Deleted

## 2014-03-08 VITALS — BP 110/61 | HR 72

## 2014-03-08 DIAGNOSIS — E291 Testicular hypofunction: Secondary | ICD-10-CM

## 2014-03-08 MED ORDER — TESTOSTERONE CYPIONATE 200 MG/ML IM SOLN
200.0000 mg | Freq: Once | INTRAMUSCULAR | Status: AC
  Administered 2014-03-08: 200 mg via INTRAMUSCULAR

## 2014-03-08 NOTE — Progress Notes (Signed)
   Subjective:    Patient ID: Jordan Marquez, male    DOB: 01/24/1951, 63 y.o.   MRN: [id number] Testosterone given IM RUOQ. No complications. Donne AnonAmber Renita Brocks, CMA HPI    Review of Systems     Objective:   Physical Exam        Assessment & Plan:

## 2014-03-30 ENCOUNTER — Ambulatory Visit (INDEPENDENT_AMBULATORY_CARE_PROVIDER_SITE_OTHER): Payer: BC Managed Care – PPO | Admitting: Family Medicine

## 2014-03-30 ENCOUNTER — Encounter: Payer: Self-pay | Admitting: *Deleted

## 2014-03-30 VITALS — BP 116/69 | HR 65 | Wt 208.0 lb

## 2014-03-30 DIAGNOSIS — E291 Testicular hypofunction: Secondary | ICD-10-CM

## 2014-03-30 MED ORDER — TESTOSTERONE CYPIONATE 200 MG/ML IM SOLN
200.0000 mg | Freq: Once | INTRAMUSCULAR | Status: AC
  Administered 2014-03-30: 200 mg via INTRAMUSCULAR

## 2014-03-30 NOTE — Progress Notes (Signed)
   Subjective:    Patient ID: Jordan Marquez, male    DOB: 02/14/1951, 63 y.o.   MRN: [id number] Testosterone given IM LUOQ.  No complications. Donne AnonAmber Tina Gruner, CMA HPI    Review of Systems     Objective:   Physical Exam        Assessment & Plan:

## 2014-04-17 IMAGING — CR DG WRIST COMPLETE 3+V*L*
2 series · 2 of 2 positions shown · non-contrast
Comparison: None.

CLINICAL DATA: Wrist pain for 5 weeks with tenderness, no definite
injury

EXAM:
LEFT WRIST - COMPLETE 3+ VIEW

[view not recorded (1 of 2)]
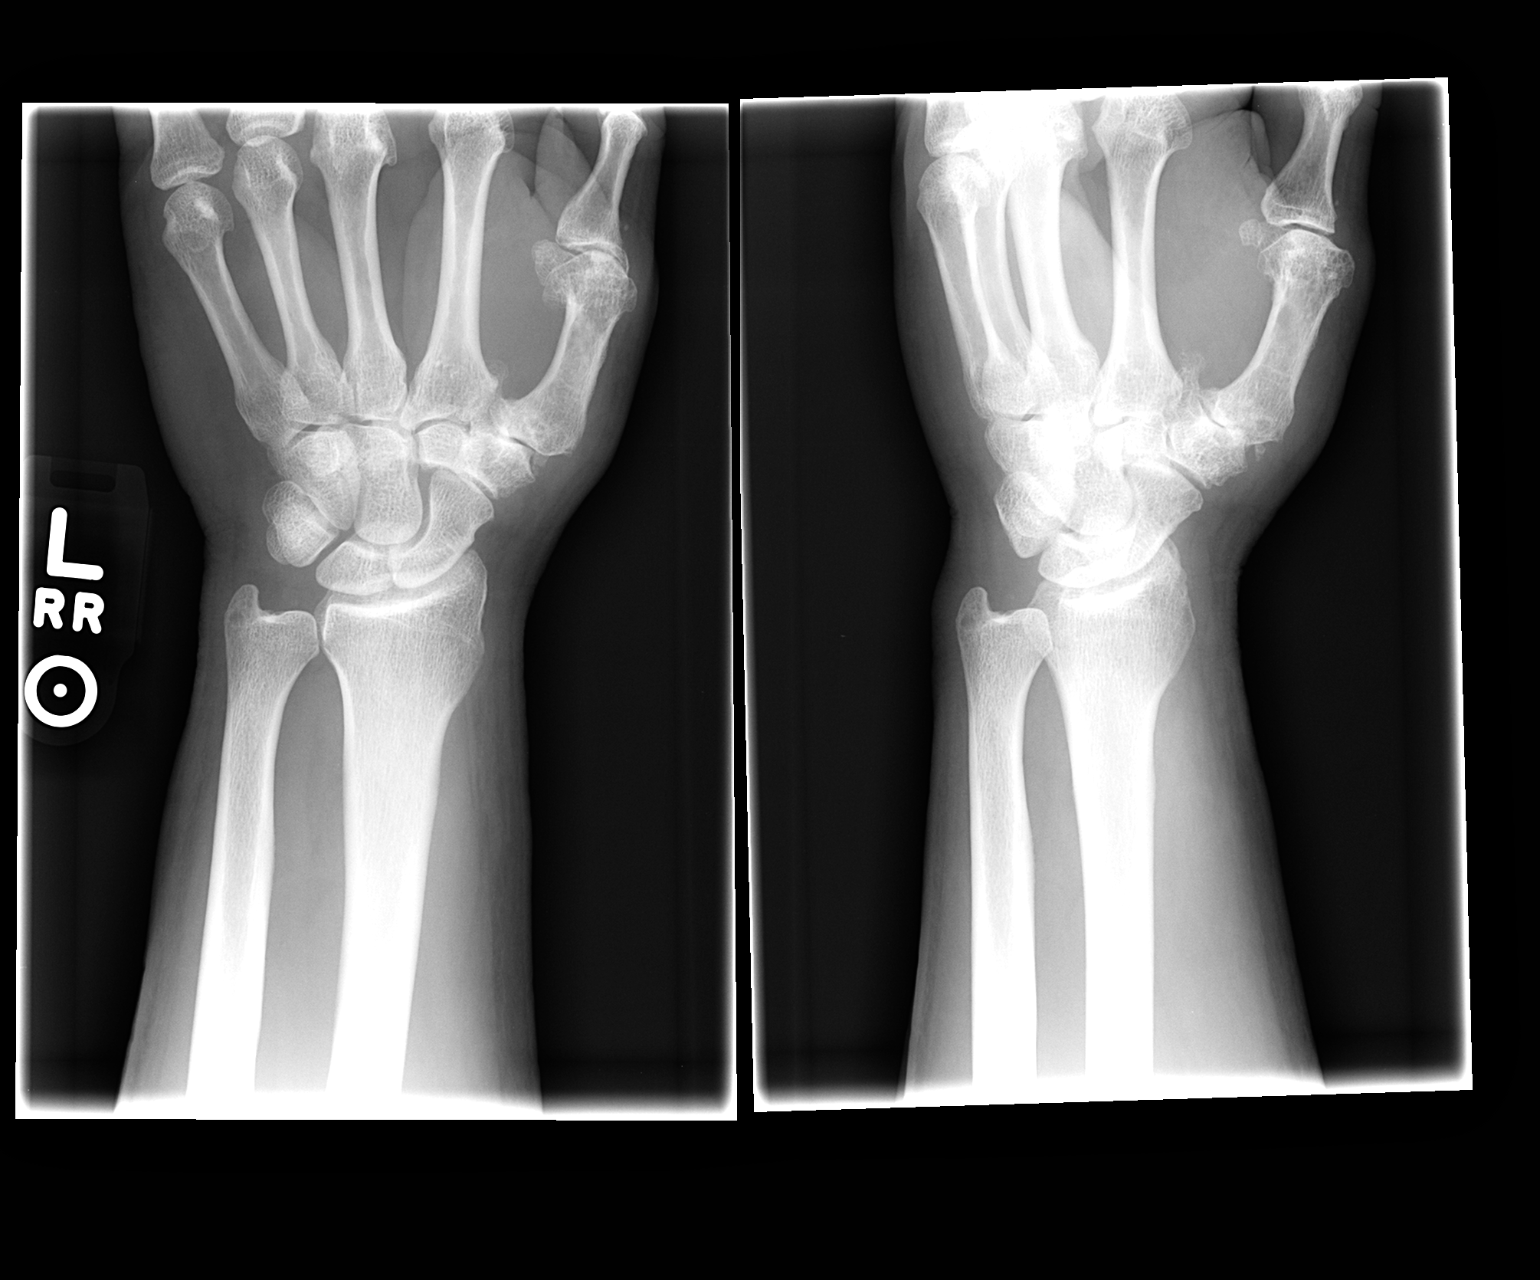

[view not recorded (2 of 2)]
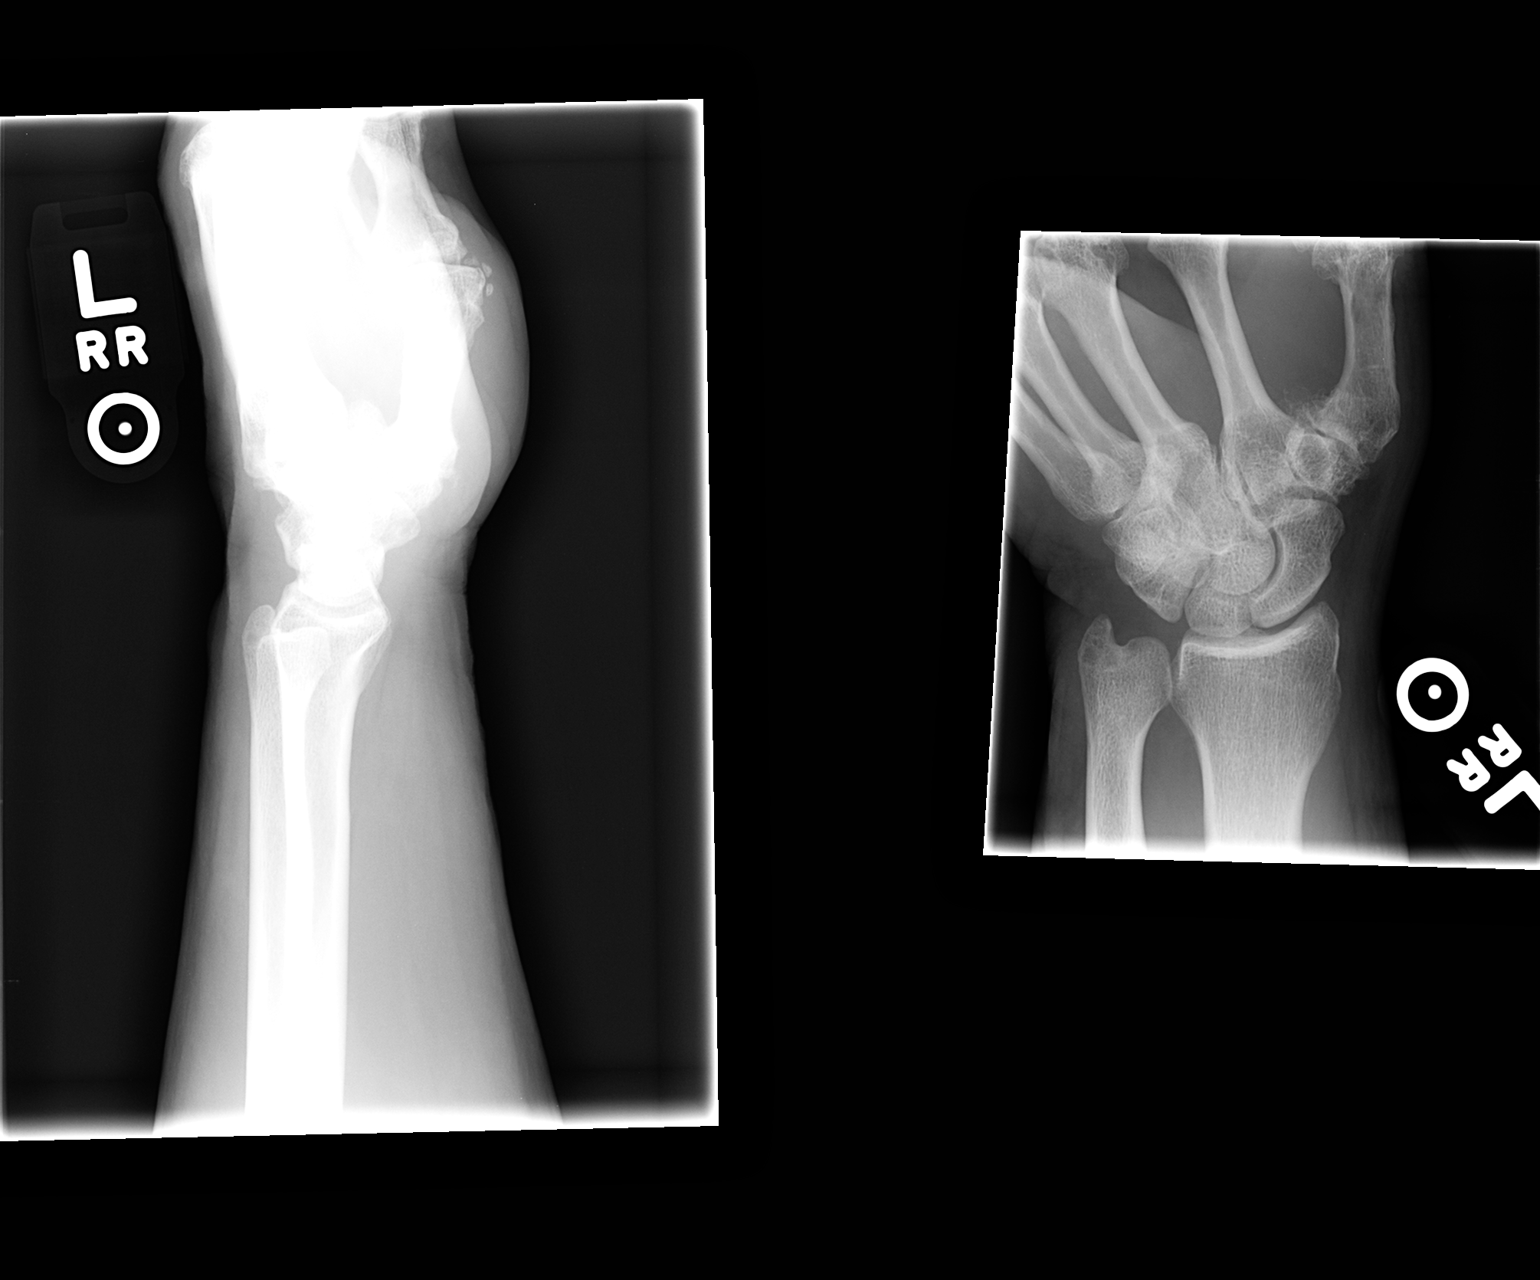

[2 of 2 positions shown; findings below may reference images not displayed]

FINDINGS: The radiocarpal joint space appears normal and the ulnar styloid is
intact. The carpal bones are in normal position. However there is
moderate degenerative joint disease at the articulation of the base
of the 1st metacarpal with the trapezium. There is loss of joint
space, sclerosis, and spurring present. There is also mild
degenerative change at the left 1st MCP joint.
IMPRESSION: Degenerative change particularly at the articulation of the base of
the 1st metacarpal with the trapezium. No acute abnormality.

## 2014-04-22 ENCOUNTER — Other Ambulatory Visit: Payer: Self-pay | Admitting: Family Medicine

## 2014-04-27 ENCOUNTER — Other Ambulatory Visit: Payer: Self-pay | Admitting: Family Medicine

## 2014-05-04 ENCOUNTER — Ambulatory Visit (INDEPENDENT_AMBULATORY_CARE_PROVIDER_SITE_OTHER): Payer: BC Managed Care – PPO | Admitting: Sports Medicine

## 2014-05-04 ENCOUNTER — Encounter: Payer: Self-pay | Admitting: *Deleted

## 2014-05-04 VITALS — BP 111/68 | HR 65 | Wt 207.0 lb

## 2014-05-04 DIAGNOSIS — E291 Testicular hypofunction: Secondary | ICD-10-CM

## 2014-05-04 MED ORDER — TESTOSTERONE CYPIONATE 200 MG/ML IM SOLN: 200.0000 mg | Freq: Once | INTRAMUSCULAR | Status: DC

## 2014-05-04 NOTE — Assessment & Plan Note (Signed)
Testosterone injection as above. 

## 2014-05-04 NOTE — Progress Notes (Signed)
Pt here for testosterone injection no SOB,CP or mood swings. Injection tolerated well given in RUOQ.Deno Etienne

## 2014-05-05 IMAGING — CR DG CERVICAL SPINE COMPLETE 4+V
5 series · 5 of 5 positions shown · non-contrast
Comparison: None.

CLINICAL DATA: Right-sided neck pain after lifting object on
today's exam.

EXAM:
CERVICAL SPINE  4+ VIEWS

[view not recorded (1 of 5)]
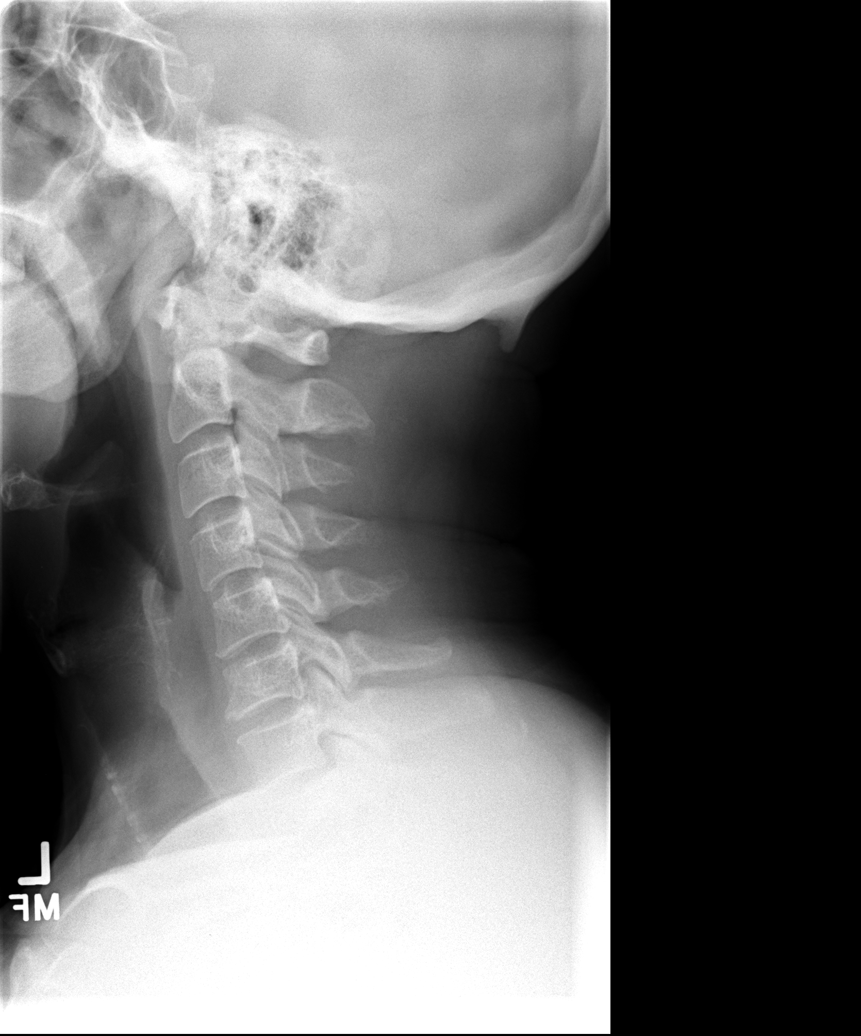

[view not recorded (2 of 5)]
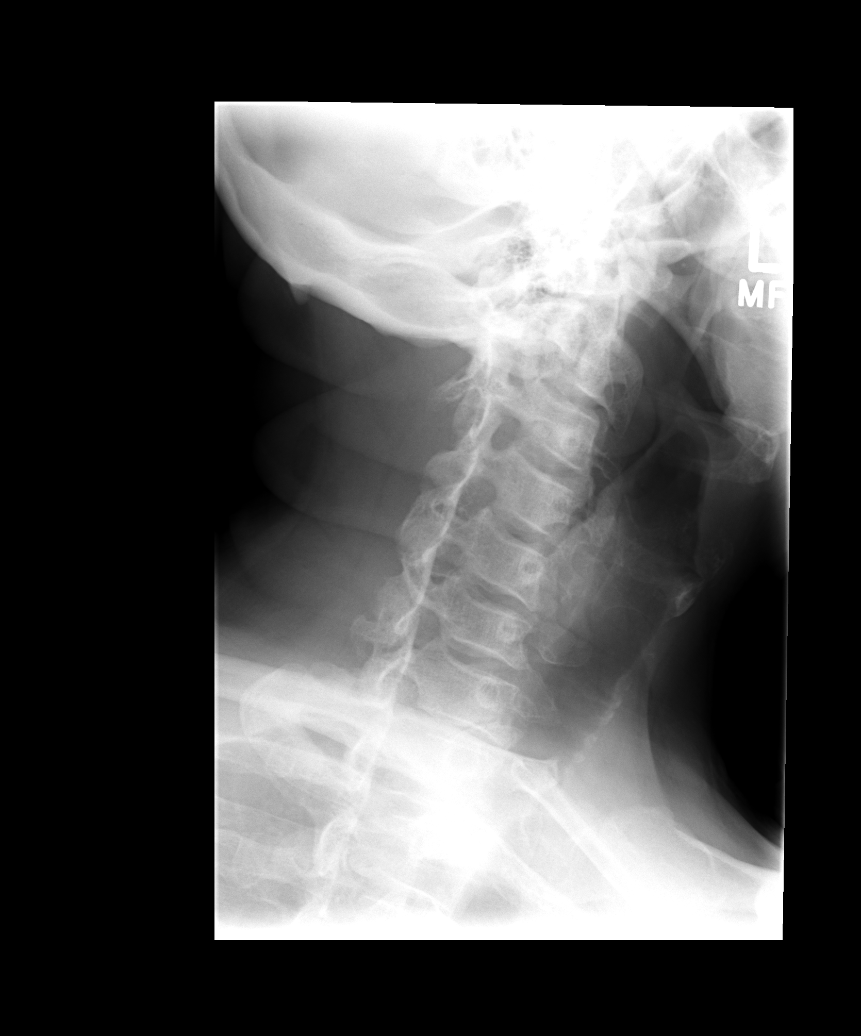

[view not recorded (3 of 5)]
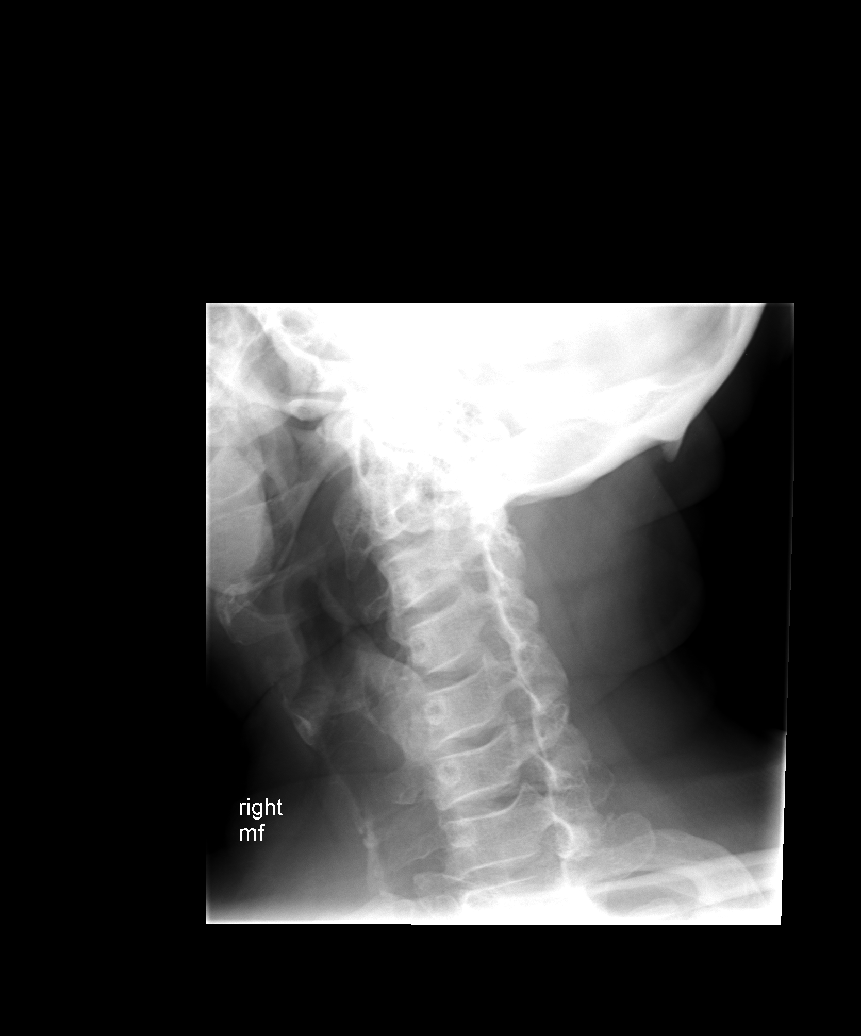

[view not recorded (4 of 5)]
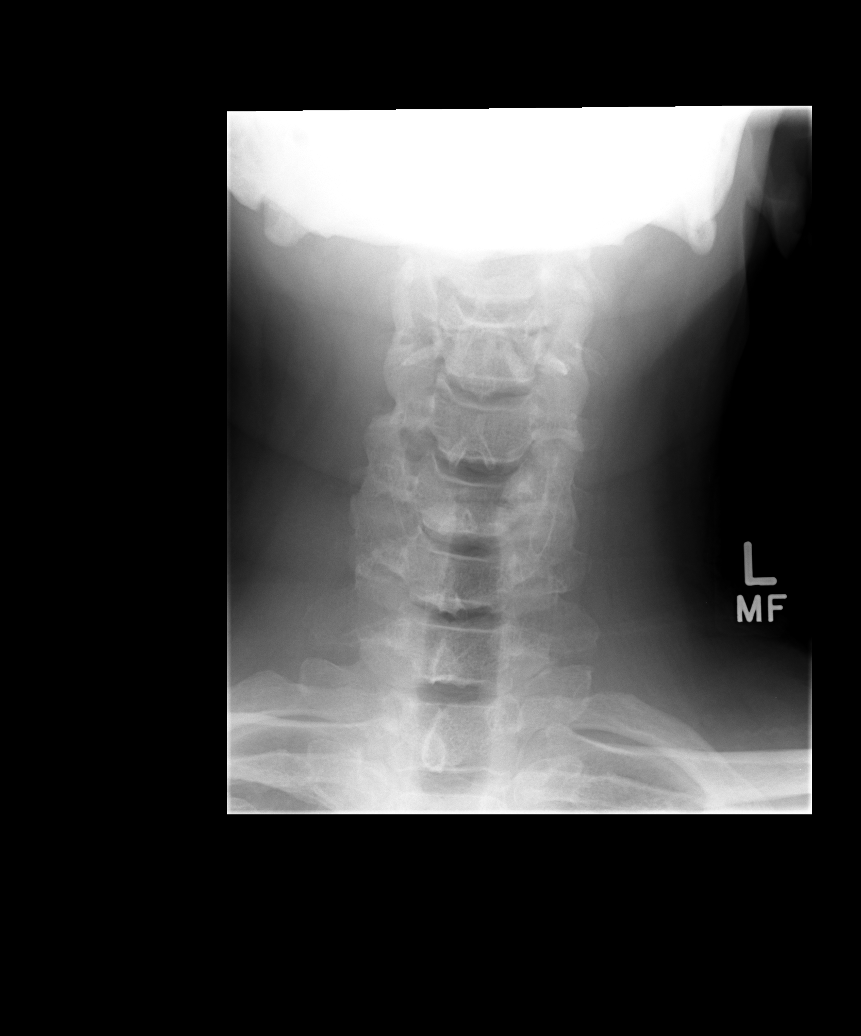

[view not recorded (5 of 5)]
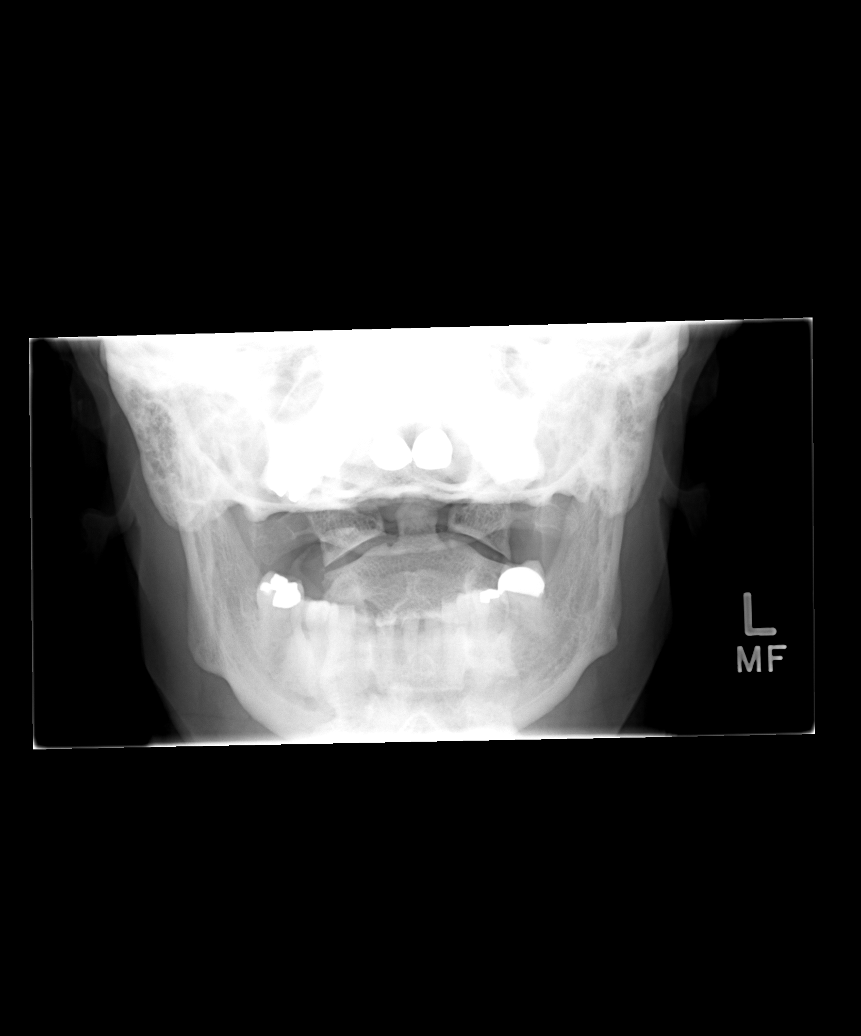

[5 of 5 positions shown; findings below may reference images not displayed]

FINDINGS: There is no evidence of cervical spine fracture or prevertebral soft
tissue swelling. Alignment is normal. No other significant bone
abnormalities are identified.
IMPRESSION: Negative cervical spine radiographs.

## 2014-05-08 ENCOUNTER — Ambulatory Visit: Payer: BC Managed Care – PPO | Admitting: Family Medicine

## 2014-05-13 ENCOUNTER — Other Ambulatory Visit: Payer: Self-pay | Admitting: Family Medicine

## 2014-05-31 ENCOUNTER — Ambulatory Visit (INDEPENDENT_AMBULATORY_CARE_PROVIDER_SITE_OTHER): Payer: BC Managed Care – PPO | Admitting: Family Medicine

## 2014-05-31 ENCOUNTER — Other Ambulatory Visit: Payer: Self-pay | Admitting: *Deleted

## 2014-05-31 VITALS — BP 132/76 | HR 62 | Ht 67.0 in | Wt 208.0 lb

## 2014-05-31 DIAGNOSIS — E291 Testicular hypofunction: Secondary | ICD-10-CM

## 2014-05-31 DIAGNOSIS — E349 Endocrine disorder, unspecified: Secondary | ICD-10-CM

## 2014-05-31 MED ORDER — INSULIN DETEMIR 100 UNIT/ML FLEXPEN: PEN_INJECTOR | SUBCUTANEOUS | Status: DC

## 2014-05-31 MED ORDER — TESTOSTERONE CYPIONATE 200 MG/ML IM SOLN
200.0000 mg | Freq: Once | INTRAMUSCULAR | Status: AC
  Administered 2014-05-31: 200 mg via INTRAMUSCULAR

## 2014-05-31 NOTE — Progress Notes (Signed)
   Subjective:    Patient ID: Jordan BucklesKenneth T Michiels, male    DOB: 09/29/1951, 63 y.o.   MRN: [id number]  HPI Patient presents today for testosterone injection. Patient has no concerns at this time. Patient did receive his injection on left outer quad with out complication.   Review of Systems     Objective:   Physical Exam        Assessment & Plan:

## 2014-05-31 NOTE — Telephone Encounter (Signed)
[facility]. Patient has appt in July. Corliss SkainsJamie Painter, CMA

## 2014-06-07 LAB — HM DIABETES EYE EXAM

## 2014-06-26 ENCOUNTER — Ambulatory Visit: Payer: BC Managed Care – PPO | Admitting: Family Medicine

## 2014-07-03 ENCOUNTER — Encounter: Payer: Self-pay | Admitting: Family Medicine

## 2014-07-03 ENCOUNTER — Ambulatory Visit (INDEPENDENT_AMBULATORY_CARE_PROVIDER_SITE_OTHER): Payer: BC Managed Care – PPO | Admitting: Family Medicine

## 2014-07-03 VITALS — BP 124/80 | HR 71 | Ht 67.0 in | Wt 211.0 lb

## 2014-07-03 DIAGNOSIS — E119 Type 2 diabetes mellitus without complications: Secondary | ICD-10-CM

## 2014-07-03 DIAGNOSIS — E7800 Pure hypercholesterolemia, unspecified: Secondary | ICD-10-CM

## 2014-07-03 DIAGNOSIS — E291 Testicular hypofunction: Secondary | ICD-10-CM

## 2014-07-03 LAB — LIPID PANEL
Cholesterol: 155 mg/dL (ref 0–200)
HDL: 51 mg/dL (ref >39)
LDL Cholesterol: 74 mg/dL (ref 0–99)
Total CHOL/HDL Ratio: 3 Ratio
Triglycerides: 148 mg/dL (ref <150)
VLDL: 30 mg/dL (ref 0–40)

## 2014-07-03 LAB — COMPLETE METABOLIC PANEL WITH GFR
ALBUMIN: 4.2 g/dL (ref 3.5–5.2)
ALT: 23 U/L (ref 0–53)
AST: 21 U/L (ref 0–37)
Alkaline Phosphatase: 52 U/L (ref 39–117)
BILIRUBIN TOTAL: 1.6 mg/dL — AB (ref 0.2–1.2)
BUN: 27 mg/dL — ABNORMAL HIGH (ref 6–23)
CALCIUM: 9.2 mg/dL (ref 8.4–10.5)
CO2: 25 meq/L (ref 19–32)
Chloride: 104 mEq/L (ref 96–112)
Creat: 0.91 mg/dL (ref 0.50–1.35)
GFR, EST NON AFRICAN AMERICAN: 89 mL/min
Glucose, Bld: 158 mg/dL — ABNORMAL HIGH (ref 70–99)
POTASSIUM: 4.3 meq/L (ref 3.5–5.3)
SODIUM: 136 meq/L (ref 135–145)
TOTAL PROTEIN: 7 g/dL (ref 6.0–8.3)

## 2014-07-03 LAB — POCT GLYCOSYLATED HEMOGLOBIN (HGB A1C): HEMOGLOBIN A1C: 7.9

## 2014-07-03 MED ORDER — CANAGLIFLOZIN 300 MG PO TABS: 300.0000 mg | ORAL_TABLET | Freq: Every day | ORAL | Status: DC

## 2014-07-03 MED ORDER — TESTOSTERONE CYPIONATE 200 MG/ML IM SOLN
200.0000 mg | Freq: Once | INTRAMUSCULAR | Status: AC
  Administered 2014-07-03: 200 mg via INTRAMUSCULAR

## 2014-07-03 MED ORDER — CANAGLIFLOZIN 100 MG PO TABS: 100.0000 mg | ORAL_TABLET | Freq: Every day | ORAL | Status: DC
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
## 2014-07-03 NOTE — Addendum Note (Signed)
Addended by: Corliss SkainsPAINTER, Theodore Virgin on: 07/03/2014 12:04 PM   Modules accepted: Orders

## 2014-07-04 ENCOUNTER — Encounter: Payer: Self-pay | Admitting: Family Medicine

## 2014-07-04 DIAGNOSIS — R17 Unspecified jaundice: Secondary | ICD-10-CM | POA: Insufficient documentation

## 2014-07-28 ENCOUNTER — Ambulatory Visit (INDEPENDENT_AMBULATORY_CARE_PROVIDER_SITE_OTHER): Payer: BC Managed Care – PPO | Admitting: Family Medicine

## 2014-07-28 VITALS — BP 106/63 | HR 64

## 2014-07-28 DIAGNOSIS — E291 Testicular hypofunction: Secondary | ICD-10-CM

## 2014-07-28 MED ORDER — TESTOSTERONE CYPIONATE 200 MG/ML IM SOLN
200.0000 mg | Freq: Once | INTRAMUSCULAR | Status: AC
  Administered 2014-07-28: 200 mg via INTRAMUSCULAR

## 2014-07-28 NOTE — Progress Notes (Signed)
   Subjective:    Patient ID: Jordan Marquez, male    DOB: 05/15/1951, 63 y.o.   MRN: [id number]  HPI  Jordan Marquez is here for a testosterone injection. Denies chest pain, shortness of breath, headaches or mood changes.   Review of Systems     Objective:   Physical Exam        Assessment & Plan:  Patient tolerated injection well without complications.

## 2014-08-20 ENCOUNTER — Other Ambulatory Visit: Payer: Self-pay | Admitting: Family Medicine

## 2014-08-25 ENCOUNTER — Other Ambulatory Visit: Payer: Self-pay | Admitting: *Deleted

## 2014-08-25 MED ORDER — CANAGLIFLOZIN 300 MG PO TABS: 300.0000 mg | ORAL_TABLET | Freq: Every day | ORAL | Status: DC

## 2014-08-29 ENCOUNTER — Ambulatory Visit (INDEPENDENT_AMBULATORY_CARE_PROVIDER_SITE_OTHER): Payer: BC Managed Care – PPO | Admitting: Family Medicine

## 2014-08-29 VITALS — BP 123/67 | HR 67 | Ht 67.0 in | Wt 205.0 lb

## 2014-08-29 DIAGNOSIS — E291 Testicular hypofunction: Secondary | ICD-10-CM | POA: Diagnosis not present

## 2014-08-29 MED ORDER — TESTOSTERONE CYPIONATE 200 MG/ML IM SOLN
200.0000 mg | Freq: Once | INTRAMUSCULAR | Status: AC
  Administered 2014-08-29: 200 mg via INTRAMUSCULAR

## 2014-08-29 NOTE — Progress Notes (Signed)
   Subjective:    Patient ID: Jordan Marquez, male    DOB: September 27, 1951, 63 y.o.   MRN: [id number]  HPI Pt reports today for routine testosterone injection which he rec'd in his RUOQ without complication. He denies CP, SOB or mood swings and was reminded to RTC in 2 weeks for next injection. Corliss Skains, CMA   Review of Systems     Objective:   Physical Exam        Assessment & Plan:

## 2014-10-03 ENCOUNTER — Ambulatory Visit (INDEPENDENT_AMBULATORY_CARE_PROVIDER_SITE_OTHER): Payer: BC Managed Care – PPO | Admitting: Family Medicine

## 2014-10-03 ENCOUNTER — Encounter: Payer: Self-pay | Admitting: Family Medicine

## 2014-10-03 VITALS — BP 124/72 | HR 59 | Ht 67.0 in | Wt 207.0 lb

## 2014-10-03 DIAGNOSIS — E291 Testicular hypofunction: Secondary | ICD-10-CM | POA: Diagnosis not present

## 2014-10-03 MED ORDER — TESTOSTERONE CYPIONATE 200 MG/ML IM SOLN
200.0000 mg | Freq: Once | INTRAMUSCULAR | Status: AC
  Administered 2014-10-03: 200 mg via INTRAMUSCULAR

## 2014-10-03 NOTE — Progress Notes (Signed)
   Subjective:    Patient ID: Jordan Marquez, male    DOB: 05/18/1951, 63 y.o.   MRN: [id number]  HPI Jordan Marquez reports today for testosterone injection which he rec'd in his LUOQ without complication. He denies headache, CP, SOB or mood swings. No other GU concerns at this time. Corliss SkainsJamie Eilee Schader, RMA   Review of Systems     Objective:   Physical Exam        Assessment & Plan:  RTC in 2 weeks for next testosterone injection.

## 2014-10-10 ENCOUNTER — Encounter: Payer: Self-pay | Admitting: Family Medicine

## 2014-10-10 ENCOUNTER — Ambulatory Visit (INDEPENDENT_AMBULATORY_CARE_PROVIDER_SITE_OTHER): Payer: BC Managed Care – PPO | Admitting: Family Medicine

## 2014-10-10 VITALS — BP 108/58 | HR 75 | Ht 67.0 in | Wt 209.0 lb

## 2014-10-10 DIAGNOSIS — IMO0001 Reserved for inherently not codable concepts without codable children: Secondary | ICD-10-CM

## 2014-10-10 DIAGNOSIS — E1165 Type 2 diabetes mellitus with hyperglycemia: Secondary | ICD-10-CM

## 2014-10-10 MED ORDER — CANAGLIFLOZIN 300 MG PO TABS: 300.0000 mg | ORAL_TABLET | Freq: Every day | ORAL | Status: DC

## 2014-10-10 NOTE — Progress Notes (Signed)
   Subjective:    Patient ID: Jordan Marquez, male    DOB: 07/31/1951, 63 y.o.   MRN: [id number]  HPI Diabetes - no hypoglycemic events. No wounds or sores that are not healing well. No increased thirst or urination. Checking glucose at home. Running under 150 to 100.  Taking medications as prescribed without any side effects.Says invokana is working well.    Review of Systems     Objective:   Physical Exam  Constitutional: He is oriented to person, place, and time. He appears well-developed and well-nourished.  HENT:  Head: Normocephalic and atraumatic.  Cardiovascular: Normal rate, regular rhythm and normal heart sounds.   Pulmonary/Chest: Effort normal and breath sounds normal.  Neurological: He is alert and oriented to person, place, and time.  Skin: Skin is warm and dry.  Psychiatric: He has a normal mood and affect. His behavior is normal.          Assessment & Plan:  DM- on statin, ASA, and ACi.  Foot exam done today. Eye exam is UTD.  Will check A1C next week when returns for his testosterone. OUr controls are out on our machine.  He prefers not to have lab draw.

## 2014-10-18 ENCOUNTER — Telehealth: Payer: Self-pay | Admitting: Family Medicine

## 2014-10-18 NOTE — Telephone Encounter (Signed)
Please call patient: We got a notification from [facility] that they will no longer cover his Invokana. We will need to change Com[place]osFarxiga or Jardiance. When he is ready for a new prescription he can let us know and we can send over a new one. These all work very similar to [employer] Invokana and he should tolerate it well

## 2014-10-20 MED ORDER — EMPAGLIFLOZIN 25 MG PO TABS: 25.0000 mg | ORAL_TABLET | Freq: Every day | ORAL | Status: DC

## 2014-10-20 NOTE — Telephone Encounter (Signed)
He is ok with either. He does want a 90 supply.

## 2014-10-20 NOTE — Telephone Encounter (Signed)
New rx sent. He can finish out the Invokana that he's currently taking. And then start the new medication once he runs out.

## 2014-10-24 ENCOUNTER — Ambulatory Visit: Payer: BC Managed Care – PPO

## 2014-10-25 NOTE — Telephone Encounter (Signed)
Patient advised.

## 2014-10-31 ENCOUNTER — Ambulatory Visit (INDEPENDENT_AMBULATORY_CARE_PROVIDER_SITE_OTHER): Payer: BC Managed Care – PPO | Admitting: Family Medicine

## 2014-10-31 ENCOUNTER — Telehealth: Payer: Self-pay | Admitting: Family Medicine

## 2014-10-31 VITALS — BP 109/59 | HR 60 | Wt 204.0 lb

## 2014-10-31 DIAGNOSIS — E1165 Type 2 diabetes mellitus with hyperglycemia: Secondary | ICD-10-CM

## 2014-10-31 DIAGNOSIS — E291 Testicular hypofunction: Secondary | ICD-10-CM

## 2014-10-31 DIAGNOSIS — IMO0002 Reserved for concepts with insufficient information to code with codable children: Secondary | ICD-10-CM

## 2014-10-31 LAB — POCT GLYCOSYLATED HEMOGLOBIN (HGB A1C): HEMOGLOBIN A1C: 7.6

## 2014-10-31 MED ORDER — TESTOSTERONE CYPIONATE 200 MG/ML IM SOLN
200.0000 mg | Freq: Once | INTRAMUSCULAR | Status: AC
  Administered 2014-10-31: 200 mg via INTRAMUSCULAR

## 2014-10-31 NOTE — Telephone Encounter (Signed)
Please call patient and let him know that his A1c did look a little bit better today which is great. Still not quite at goal though. I believe he is taking 45 units of Levemir. I would like him to increase this to 48 units and follow-up in 3 months. Continue to work on healthy diet and regular exercise and weight loss.

## 2014-10-31 NOTE — Telephone Encounter (Signed)
Patient advised of results.

## 2014-10-31 NOTE — Progress Notes (Signed)
   Subjective:    Patient ID: Jordan Marquez, male    DOB: 10/27/1951, 63 y.o.   MRN: [id number]  HPI  Jordan BucklesKenneth T Frederic is here for a testosterone injection. Denies chest pain, shortness of breath, headaches or mood changes.   Diabetes Mellitus - HgBA1C will be preformed today.   Immunization - Iantha FallenKenneth would like to receive the shingles vaccine.    Review of Systems     Objective:   Physical Exam        Assessment & Plan:  Patient tolerated injection well without complications. Patient advised to schedule next injection 14 days from today.   Diabetes Mellitus- HgBA1C 7.6 %.  Have him increase Levemir to 48 units at bedtime. F/U in 3 months for diabetes.   Immunization - Patient's plan does not cover immunizations. Patient did not receive shingles vaccine today. If patient keeps the same insurance plan immunizations will be covered in 2016. Patient advised to schedule visit for 2016 to receive the shingles vaccine.   Nani Gasseratherine Metheney, MD

## 2014-11-04 ENCOUNTER — Other Ambulatory Visit: Payer: Self-pay | Admitting: Family Medicine

## 2014-11-23 ENCOUNTER — Ambulatory Visit (INDEPENDENT_AMBULATORY_CARE_PROVIDER_SITE_OTHER): Payer: BC Managed Care – PPO | Admitting: Family Medicine

## 2014-11-23 VITALS — BP 124/73 | HR 60 | Ht 67.0 in | Wt 200.0 lb

## 2014-11-23 DIAGNOSIS — E291 Testicular hypofunction: Secondary | ICD-10-CM

## 2014-11-23 MED ORDER — TESTOSTERONE CYPIONATE 100 MG/ML IM SOLN
200.0000 mg | Freq: Once | INTRAMUSCULAR | Status: AC
  Administered 2014-11-23: 200 mg via INTRAMUSCULAR

## 2014-11-23 NOTE — Progress Notes (Signed)
   Subjective:    Patient ID: Jordan Marquez, male    DOB: 12/31/1950, [age] y.o.   MRN: [id number]  HPI  Jordan LinkKen reports to office today for routine testosterone injection. He currently denies headache, CP, SOB or mood swings. No other GU symptoms at this time.     Review of Systems     Objective:   Physical Exam        Assessment & Plan:  RTC in 2 weeks for next injection.  Jordan Marquez lab slip. He needs to go for bloodwork in one week. This includes a PSA testosterone and liver enzymes.

## 2014-12-18 ENCOUNTER — Other Ambulatory Visit: Payer: Self-pay | Admitting: Family Medicine

## 2014-12-19 ENCOUNTER — Ambulatory Visit (INDEPENDENT_AMBULATORY_CARE_PROVIDER_SITE_OTHER): Payer: BLUE CROSS/BLUE SHIELD | Admitting: Family Medicine

## 2014-12-19 VITALS — BP 108/62 | HR 88 | Wt 203.0 lb

## 2014-12-19 DIAGNOSIS — E291 Testicular hypofunction: Secondary | ICD-10-CM

## 2014-12-19 MED ORDER — TESTOSTERONE CYPIONATE 200 MG/ML IM SOLN
200.0000 mg | Freq: Once | INTRAMUSCULAR | Status: AC
  Administered 2014-12-19: 200 mg via INTRAMUSCULAR

## 2014-12-19 NOTE — Progress Notes (Signed)
   Subjective:    Patient ID: Bevelyn BucklesKenneth T Tucker, male    DOB: 11/15/1951, 64 y.o.   MRN: [id number]  HPI  Bevelyn BucklesKenneth T Hanner is here for a testosterone injection. Denies chest pain, shortness of breath, headaches or mood changes.   Review of Systems     Objective:   Physical Exam        Assessment & Plan:  Patient tolerated injection well without complications. Patient advised to schedule next injection 14 days from today. Gave patient lab slip to return in 1 week for follow up labs; testosterone and PSA.

## 2014-12-27 LAB — COMPLETE METABOLIC PANEL WITH GFR
ALT: 16 U/L (ref 0–53)
AST: 18 U/L (ref 0–37)
Albumin: 4.4 g/dL (ref 3.5–5.2)
Alkaline Phosphatase: 51 U/L (ref 39–117)
BUN: 15 mg/dL (ref 6–23)
CHLORIDE: 102 meq/L (ref 96–112)
CO2: 26 mEq/L (ref 19–32)
Calcium: 9.4 mg/dL (ref 8.4–10.5)
Creat: 0.9 mg/dL (ref 0.50–1.35)
GFR, Est Non African American: >89 mL/min
GLUCOSE: 132 mg/dL — AB (ref 70–99)
POTASSIUM: 4.6 meq/L (ref 3.5–5.3)
Sodium: 138 mEq/L (ref 135–145)
Total Bilirubin: 1.1 mg/dL (ref 0.2–1.2)
Total Protein: 7 g/dL (ref 6.0–8.3)

## 2014-12-27 LAB — TESTOSTERONE, FREE, TOTAL, SHBG
SEX HORMONE BINDING: 37 nmol/L (ref 22–77)
TESTOSTERONE-% FREE: 2.2 % (ref 1.6–2.9)
TESTOSTERONE: 880 ng/dL (ref 300–890)
Testosterone, Free: 192.8 pg/mL (ref 47.0–244.0)

## 2014-12-27 LAB — PSA: PSA: 1.03 ng/mL (ref <=4.00)

## 2015-01-04 ENCOUNTER — Ambulatory Visit (INDEPENDENT_AMBULATORY_CARE_PROVIDER_SITE_OTHER): Payer: BLUE CROSS/BLUE SHIELD | Admitting: Family Medicine

## 2015-01-04 VITALS — BP 108/61 | HR 57 | Wt 202.0 lb

## 2015-01-04 DIAGNOSIS — E291 Testicular hypofunction: Secondary | ICD-10-CM | POA: Diagnosis not present

## 2015-01-04 DIAGNOSIS — E119 Type 2 diabetes mellitus without complications: Secondary | ICD-10-CM

## 2015-01-04 MED ORDER — INSULIN PEN NEEDLE 31G X 8 MM MISC: Status: DC

## 2015-01-04 MED ORDER — TESTOSTERONE CYPIONATE 200 MG/ML IM SOLN
200.0000 mg | Freq: Once | INTRAMUSCULAR | Status: AC
  Administered 2015-01-04: 200 mg via INTRAMUSCULAR

## 2015-01-04 NOTE — Progress Notes (Signed)
   Subjective:    Patient ID: Jordan Marquez, male    DOB: 02/25/1951, 64 y.o.   MRN: [id number]  HPI  Jordan Marquez is here for a testosterone injection. Denies chest pain, shortness of breath, headaches or mood changes.   He is requesting a refill on his 31G X 8 MM insulin pen needles.   Review of Systems     Objective:   Physical Exam        Assessment & Plan:  Patient tolerated injection well without complications. Patient advised to schedule next injection 14 days from today.   Refill sent to pharmacy for insulin pen needles. Patient has a follow up with Dr Linford ArnoldMetheney next week.

## 2015-01-09 ENCOUNTER — Ambulatory Visit (INDEPENDENT_AMBULATORY_CARE_PROVIDER_SITE_OTHER): Payer: BLUE CROSS/BLUE SHIELD | Admitting: Family Medicine

## 2015-01-09 ENCOUNTER — Encounter: Payer: Self-pay | Admitting: Family Medicine

## 2015-01-09 VITALS — BP 113/74 | HR 69 | Ht 67.0 in | Wt 204.0 lb

## 2015-01-09 DIAGNOSIS — R809 Proteinuria, unspecified: Secondary | ICD-10-CM

## 2015-01-09 DIAGNOSIS — IMO0002 Reserved for concepts with insufficient information to code with codable children: Secondary | ICD-10-CM

## 2015-01-09 DIAGNOSIS — E1165 Type 2 diabetes mellitus with hyperglycemia: Secondary | ICD-10-CM

## 2015-01-09 DIAGNOSIS — E669 Obesity, unspecified: Secondary | ICD-10-CM

## 2015-01-09 LAB — POCT UA - MICROALBUMIN
Albumin/Creatinine Ratio, Urine, POC: <30
Creatinine, POC: 100 mg/dL
Microalbumin Ur, POC: 30 mg/L

## 2015-01-09 MED ORDER — TAMSULOSIN HCL 0.4 MG PO CAPS: ORAL_CAPSULE | ORAL | Status: DC

## 2015-01-09 NOTE — Progress Notes (Signed)
   Subjective:    Patient ID: Jordan Marquez, male    DOB: 07/09/1951, 64 y.o.   MRN: [id number]  HPI Diabetes - no hypoglycemic events. No wounds or sores that are not healing well. No increased thirst or urination. Checking glucose at home. Running between 115-135.   Taking medications as prescribed without any side effects. Only using the Levemir once a week, so only uses if glucose is in the 130s.  Has been more active and exericsing more.    Review of Systems     Objective:   Physical Exam  Constitutional: He is oriented to person, place, and time. He appears well-developed and well-nourished.  HENT:  Head: Normocephalic and atraumatic.  Cardiovascular: Normal rate, regular rhythm and normal heart sounds.   Pulmonary/Chest: Effort normal and breath sounds normal.  Neurological: He is alert and oriented to person, place, and time.  Skin: Skin is warm and dry.  Psychiatric: He has a normal mood and affect. His behavior is normal.          Assessment & Plan:  DM- Uncontrolled. Too early for A1C.  Will go at the end of the month to check A1C.  F/U in 3 months. His weight is up 2 lbs.  Given coupon card for Jardiance.  He's really only using his Levemir about once a week on average. We may need to actually switch this to mealtime insulin so that he can use it before he eats things that he knows her going to increase his blood sugar. Right now we will just continue with his current regimen and see what his A1c is at the end of the month and then can make adjustments at that time. On ACE inhibitor, aspirin, statin.  BMI 31 - work on diet and exercise.   Urinary-due to recheck urine. He is on ACE inhibitor.

## 2015-02-01 ENCOUNTER — Ambulatory Visit (INDEPENDENT_AMBULATORY_CARE_PROVIDER_SITE_OTHER): Payer: BLUE CROSS/BLUE SHIELD | Admitting: Family Medicine

## 2015-02-01 VITALS — BP 104/61 | HR 67 | Ht 67.0 in | Wt 198.0 lb

## 2015-02-01 DIAGNOSIS — E291 Testicular hypofunction: Secondary | ICD-10-CM | POA: Diagnosis not present

## 2015-02-01 DIAGNOSIS — E118 Type 2 diabetes mellitus with unspecified complications: Secondary | ICD-10-CM | POA: Diagnosis not present

## 2015-02-01 LAB — POCT GLYCOSYLATED HEMOGLOBIN (HGB A1C): Hemoglobin A1C: 7.6

## 2015-02-01 MED ORDER — TESTOSTERONE CYPIONATE 200 MG/ML IM SOLN
200.0000 mg | Freq: Once | INTRAMUSCULAR | Status: AC
  Administered 2015-02-01: 200 mg via INTRAMUSCULAR

## 2015-02-01 NOTE — Progress Notes (Addendum)
   Subjective:    Patient ID: Jordan Marquez, male    DOB: 12/15/1950, 64 y.o.   MRN: [id number] Pt in this morning for his testosterone injection and for an a1c.  Injection given RUOQ with no complications. A1c is 7.6 today  Arrow Electronicsmber Jordan Marquez, CMA HPI  Called pt & notified him of MD instructions.  Jordan Marquez, CMA   Review of Systems     Objective:   Physical Exam        Assessment & Plan:  Diabetes-we had him check his A1c today because he came in for his checkup about a week to early. A1c is elevated at 7.6 today. He needs to make sure that he is using the Levemir every single night and not just once a week. This will make a big difference in his overall glucose levels. If he feels like his sugars are going to 11 call the office and we can adjust the dose on the medication.  Nani Gasseratherine Metheney, MD

## 2015-02-20 ENCOUNTER — Ambulatory Visit (INDEPENDENT_AMBULATORY_CARE_PROVIDER_SITE_OTHER): Payer: BLUE CROSS/BLUE SHIELD | Admitting: Family Medicine

## 2015-02-20 VITALS — BP 101/62 | HR 72

## 2015-02-20 DIAGNOSIS — E291 Testicular hypofunction: Secondary | ICD-10-CM

## 2015-02-20 MED ORDER — TESTOSTERONE CYPIONATE 200 MG/ML IM SOLN
200.0000 mg | Freq: Once | INTRAMUSCULAR | Status: AC
  Administered 2015-02-20: 200 mg via INTRAMUSCULAR

## 2015-02-20 NOTE — Progress Notes (Signed)
   Subjective:    Patient ID: Jordan BucklesKenneth T Marquez, male    DOB: 09/20/1951, 64 y.o.   MRN: [id number] Pt in today for testosterone injection. Given LUOQ with no complications. Donne AnonAmber Johanna Stafford, CMA HPI    Review of Systems     Objective:   Physical Exam        Assessment & Plan:

## 2015-03-08 ENCOUNTER — Ambulatory Visit (INDEPENDENT_AMBULATORY_CARE_PROVIDER_SITE_OTHER): Payer: BLUE CROSS/BLUE SHIELD | Admitting: Family Medicine

## 2015-03-08 VITALS — BP 98/62 | HR 74 | Ht 67.0 in | Wt 202.0 lb

## 2015-03-08 DIAGNOSIS — E291 Testicular hypofunction: Secondary | ICD-10-CM | POA: Diagnosis not present

## 2015-03-08 MED ORDER — TESTOSTERONE CYPIONATE 200 MG/ML IM SOLN
200.0000 mg | Freq: Once | INTRAMUSCULAR | Status: AC
  Administered 2015-03-08: 200 mg via INTRAMUSCULAR

## 2015-03-08 NOTE — Progress Notes (Signed)
   Subjective:    Patient ID: Jordan Marquez, male    DOB: 08/25/1951, 64 y.o.   MRN: [id number]  HPI Patient came into office today for testosterone injection. Denies chest pain, shortness of breath, headaches and problems associated with taking this medication. Patient states he has had no abnornal mood swings. Patient states he will contact his insurance and hopefully be able to get the shingles vaccine at this next visit.    Review of Systems     Objective:   Physical Exam        Assessment & Plan:  Patient tolerated injection in ROUQ well without complications. Patient advised to schedule his next injection for 2 weeks from today.

## 2015-03-22 ENCOUNTER — Ambulatory Visit (INDEPENDENT_AMBULATORY_CARE_PROVIDER_SITE_OTHER): Payer: BLUE CROSS/BLUE SHIELD | Admitting: Family Medicine

## 2015-03-22 VITALS — BP 93/59 | HR 72

## 2015-03-22 DIAGNOSIS — E291 Testicular hypofunction: Secondary | ICD-10-CM

## 2015-03-22 MED ORDER — TESTOSTERONE CYPIONATE 200 MG/ML IM SOLN
200.0000 mg | Freq: Once | INTRAMUSCULAR | Status: AC
  Administered 2015-03-22: 200 mg via INTRAMUSCULAR

## 2015-03-22 NOTE — Progress Notes (Signed)
   Subjective:    Patient ID: Jordan Marquez, male    DOB: 04/28/1951, 64 y.o.   MRN: [id number] Pt here for testosterone.  Given LUOQ; no complications. Jordan Marquez, CMA HPI    Review of Systems     Objective:   Physical Exam        Assessment & Plan:

## 2015-04-05 ENCOUNTER — Ambulatory Visit (INDEPENDENT_AMBULATORY_CARE_PROVIDER_SITE_OTHER): Payer: BLUE CROSS/BLUE SHIELD | Admitting: Family Medicine

## 2015-04-05 VITALS — BP 103/60 | HR 70 | Wt 205.0 lb

## 2015-04-05 DIAGNOSIS — E291 Testicular hypofunction: Secondary | ICD-10-CM | POA: Diagnosis not present

## 2015-04-05 MED ORDER — TESTOSTERONE CYPIONATE 200 MG/ML IM SOLN
200.0000 mg | Freq: Once | INTRAMUSCULAR | Status: AC
  Administered 2015-04-05: 200 mg via INTRAMUSCULAR

## 2015-04-05 NOTE — Progress Notes (Signed)
   Subjective:    Patient ID: Jordan Marquez, male    DOB: 06/17/1951, 64 y.o.   MRN: [id number]  HPI Patient came into office today for testosterone injection. Denies chest pain, shortness of breath, headaches and problems associated with taking this medication. Patient states he has had no abnornal mood swings.    Review of Systems     Objective:   Physical Exam        Assessment & Plan:  Patient tolerated injection in ROUQ well without complications. Patient advised to schedule his next injection for 2 weeks from today.

## 2015-04-19 ENCOUNTER — Ambulatory Visit (INDEPENDENT_AMBULATORY_CARE_PROVIDER_SITE_OTHER): Payer: BLUE CROSS/BLUE SHIELD | Admitting: Family Medicine

## 2015-04-19 VITALS — BP 109/61 | HR 108 | Wt 207.0 lb

## 2015-04-19 DIAGNOSIS — E291 Testicular hypofunction: Secondary | ICD-10-CM

## 2015-04-19 MED ORDER — TESTOSTERONE CYPIONATE 200 MG/ML IM SOLN
200.0000 mg | Freq: Once | INTRAMUSCULAR | Status: AC
  Administered 2015-04-19: 200 mg via INTRAMUSCULAR

## 2015-04-19 NOTE — Progress Notes (Signed)
   Subjective:    Patient ID: Jordan Marquez, maleBevelyn Marquez    DOB: 09/22/1951, 64 y.o.   MRN: [id number]  HPI  Patient came into office today for testosterone injection. Denies chest pain, shortness of breath, headaches and problems associated with taking this medication. Patient states he has had no abnornal mood swings.    Review of Systems     Objective:   Physical Exam        Assessment & Plan:  Patient tolerated injection in LOUQ well without complications. Patient advised to schedule his next injection for 2 weeks from today.  Agree with above. Nani Gasseratherine Metheney, MD

## 2015-05-02 ENCOUNTER — Ambulatory Visit: Payer: BLUE CROSS/BLUE SHIELD | Admitting: Family Medicine

## 2015-05-08 ENCOUNTER — Ambulatory Visit: Payer: BLUE CROSS/BLUE SHIELD | Admitting: Family Medicine

## 2015-05-14 ENCOUNTER — Encounter: Payer: Self-pay | Admitting: Family Medicine

## 2015-05-14 ENCOUNTER — Ambulatory Visit (INDEPENDENT_AMBULATORY_CARE_PROVIDER_SITE_OTHER): Payer: BLUE CROSS/BLUE SHIELD | Admitting: Family Medicine

## 2015-05-14 VITALS — BP 109/68 | HR 66 | Ht 67.0 in | Wt 207.0 lb

## 2015-05-14 DIAGNOSIS — Z23 Encounter for immunization: Secondary | ICD-10-CM | POA: Diagnosis not present

## 2015-05-14 DIAGNOSIS — E1165 Type 2 diabetes mellitus with hyperglycemia: Secondary | ICD-10-CM | POA: Diagnosis not present

## 2015-05-14 DIAGNOSIS — Z1159 Encounter for screening for other viral diseases: Secondary | ICD-10-CM

## 2015-05-14 DIAGNOSIS — IMO0002 Reserved for concepts with insufficient information to code with codable children: Secondary | ICD-10-CM

## 2015-05-14 DIAGNOSIS — Z114 Encounter for screening for human immunodeficiency virus [HIV]: Secondary | ICD-10-CM

## 2015-05-14 DIAGNOSIS — E291 Testicular hypofunction: Secondary | ICD-10-CM

## 2015-05-14 DIAGNOSIS — R809 Proteinuria, unspecified: Secondary | ICD-10-CM | POA: Diagnosis not present

## 2015-05-14 LAB — POCT GLYCOSYLATED HEMOGLOBIN (HGB A1C): Hemoglobin A1C: 7.2

## 2015-05-14 MED ORDER — EMPAGLIFLOZIN 25 MG PO TABS: 25.0000 mg | ORAL_TABLET | Freq: Every day | ORAL | Status: DC

## 2015-05-14 MED ORDER — TESTOSTERONE CYPIONATE 200 MG/ML IM SOLN
200.0000 mg | Freq: Once | INTRAMUSCULAR | Status: AC
  Administered 2015-05-14: 200 mg via INTRAMUSCULAR

## 2015-05-14 MED ORDER — MEDROXYPROGESTERONE ACETATE 150 MG/ML IM SUSP: 150.0000 mg | Freq: Once | INTRAMUSCULAR | Status: DC

## 2015-05-14 NOTE — Progress Notes (Signed)
   Subjective:    Patient ID: Jordan Marquez, male    DOB: 1951-04-23, 64 y.o.   MRN: [id number]  HPI Diabetes - no hypoglycemic events. No wounds or sores that are not healing well. No increased thirst or urination. Checking glucose at home. Taking medications as prescribed without any side effects. His wife wants to put him on low carb diet with medium protein.  It is a diet from [facility] that is a ketogenic diet.   Male hypogonadism-at the beginning of the year in January, February and March, he had a the $65 visit feet have his testosterone injected. After that he met his detectable so it will be free for the rest of the year. He says he's concerned about next January when it starts all over again. 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  Microalbuminuria-he said he tried lisinopril on 2 separate occasions and bedtime it caused a dry cough. The cough resolved as soon as he stopped the medication. He said he is actually not taken in almost a year.  Review of Systems     Objective:   Physical Exam  Constitutional: He is oriented to person, place, and time. He appears well-developed and well-nourished.  HENT:  Head: Normocephalic and atraumatic.  Cardiovascular: Normal rate, regular rhythm and normal heart sounds.   Pulmonary/Chest: Effort normal and breath sounds normal.  Neurological: He is alert and oriented to person, place, and time.  Skin: Skin is warm and dry.  Psychiatric: He has a normal mood and affect. His behavior is normal.          Assessment & Plan:  DM- much improved.  A1C down yo 7.2, down from 7.6.  Refill the Jardiance.  Says he really plans on working on weight loss and diet.  Wants to hoild off on any changes for now.  F/U in 3 mo.   Male hypogonadism-we did discuss that it would certainly be fine for his daughter who is a nurse to give these injections. I would ask that she come in during  one of his visits just to make sure that she observes proper technique and that way if she has any questions before doing it on her own that she has that opportunity. Certainly she could plan to do this in November or December before the new year. I did explain to him though that he would have to get the testosterone at his local pharmacy and that may be substituted deductible for his Medicare part D as well.  Microalbuminuria-discussed putting him on an ARB since he was not able to tolerate lisinopril secondary to a cough.

## 2015-06-14 ENCOUNTER — Other Ambulatory Visit: Payer: Self-pay | Admitting: Family Medicine

## 2015-06-14 MED ORDER — GLUCOSE BLOOD VI STRP: ORAL_STRIP | Status: DC

## 2015-06-15 LAB — LIPID PANEL
CHOLESTEROL: 181 mg/dL (ref 0–200)
HDL: 51 mg/dL (ref >=40)
LDL CALC: 109 mg/dL — AB (ref 0–99)
TRIGLYCERIDES: 107 mg/dL (ref <150)
Total CHOL/HDL Ratio: 3.5 Ratio
VLDL: 21 mg/dL (ref 0–40)

## 2015-06-15 LAB — COMPLETE METABOLIC PANEL WITH GFR
ALBUMIN: 4.4 g/dL (ref 3.5–5.2)
ALT: 25 U/L (ref 0–53)
AST: 30 U/L (ref 0–37)
Alkaline Phosphatase: 50 U/L (ref 39–117)
BILIRUBIN TOTAL: 2 mg/dL — AB (ref 0.2–1.2)
BUN: 24 mg/dL — AB (ref 6–23)
CO2: 21 meq/L (ref 19–32)
Calcium: 9.3 mg/dL (ref 8.4–10.5)
Chloride: 102 mEq/L (ref 96–112)
Creat: 0.87 mg/dL (ref 0.50–1.35)
GFR, Est African American: >89 mL/min
GLUCOSE: 77 mg/dL (ref 70–99)
Potassium: 4.3 mEq/L (ref 3.5–5.3)
SODIUM: 138 meq/L (ref 135–145)
Total Protein: 7.1 g/dL (ref 6.0–8.3)

## 2015-06-15 LAB — HIV ANTIBODY (ROUTINE TESTING W REFLEX): HIV 1&2 Ab, 4th Generation: NONREACTIVE

## 2015-06-15 LAB — HEPATITIS C ANTIBODY: HCV Ab: NEGATIVE

## 2015-06-20 ENCOUNTER — Ambulatory Visit (INDEPENDENT_AMBULATORY_CARE_PROVIDER_SITE_OTHER): Payer: BLUE CROSS/BLUE SHIELD | Admitting: Family Medicine

## 2015-06-20 VITALS — BP 105/55 | HR 83 | Wt 189.0 lb

## 2015-06-20 DIAGNOSIS — E291 Testicular hypofunction: Secondary | ICD-10-CM | POA: Diagnosis not present

## 2015-06-20 MED ORDER — TESTOSTERONE CYPIONATE 200 MG/ML IM SOLN
200.0000 mg | Freq: Once | INTRAMUSCULAR | Status: AC
  Administered 2015-06-20: 200 mg via INTRAMUSCULAR

## 2015-06-20 NOTE — Progress Notes (Signed)
   Subjective:    Patient ID: Jordan Marquez, male    DOB: 12/12/1950, 64 y.o.   MRN: [id number]  HPI Patient came into office today for testosterone injection. Denies chest pain, shortness of breath, headaches and problems associated with taking this medication. Patient states he has had no abnornal mood swings. Patient did want to know if he could get the shingles vaccine, advised I would route to Provider for review. If Pt is eligable he would like to get this immunization at his next office visit for testosterone injection.    Review of Systems     Objective:   Physical Exam        Assessment & Plan:  Patient tolerated injection in LOUQ well without complications. Patient advised to schedule his next injection for 2 weeks from today.

## 2015-06-21 NOTE — Progress Notes (Signed)
Patient was seen on the 13th for testosterone injection as documented [place]. I agree with above noted.

## 2015-07-03 ENCOUNTER — Ambulatory Visit (INDEPENDENT_AMBULATORY_CARE_PROVIDER_SITE_OTHER): Payer: BLUE CROSS/BLUE SHIELD | Admitting: Family Medicine

## 2015-07-03 VITALS — BP 101/63 | HR 71

## 2015-07-03 DIAGNOSIS — E291 Testicular hypofunction: Secondary | ICD-10-CM

## 2015-07-03 MED ORDER — TESTOSTERONE CYPIONATE 200 MG/ML IM SOLN
200.0000 mg | Freq: Once | INTRAMUSCULAR | Status: AC
  Administered 2015-07-03: 200 mg via INTRAMUSCULAR

## 2015-07-03 NOTE — Progress Notes (Signed)
   Subjective:    Patient ID: Jordan Marquez, male    DOB: January 23, 1951, 64 y.o.   MRN: [id number]  HPI    Review of Systems     Objective:   Physical Exam        Assessment & Plan:

## 2015-07-03 NOTE — Progress Notes (Signed)
   Subjective:    Patient ID: Jordan Marquez, male    DOB: 09/27/1951, 64 y.o.   MRN: [id number]  HPI Patient came into office today for testosterone injection. Denies chest pain, shortness of breath, headaches and problems associated with taking this medication. Patient states he has had no abnornal mood swings.    Review of Systems     Objective:   Physical Exam        Assessment & Plan:  Patient tolerated injection in ROUQ well without complications. Patient advised to schedule his next injection for 2 weeks from today. 

## 2015-07-04 ENCOUNTER — Ambulatory Visit: Payer: BLUE CROSS/BLUE SHIELD

## 2015-07-12 ENCOUNTER — Other Ambulatory Visit: Payer: Self-pay | Admitting: Family Medicine

## 2015-07-26 ENCOUNTER — Ambulatory Visit: Payer: BLUE CROSS/BLUE SHIELD

## 2015-07-30 ENCOUNTER — Encounter: Payer: Self-pay | Admitting: Family Medicine

## 2015-07-30 ENCOUNTER — Ambulatory Visit (INDEPENDENT_AMBULATORY_CARE_PROVIDER_SITE_OTHER): Payer: BLUE CROSS/BLUE SHIELD | Admitting: Family Medicine

## 2015-07-30 DIAGNOSIS — E291 Testicular hypofunction: Secondary | ICD-10-CM | POA: Diagnosis not present

## 2015-07-30 MED ORDER — "NEEDLE (DISP) 22G X 1-1/2"" MISC": Status: AC

## 2015-07-30 MED ORDER — SYRINGE (DISPOSABLE) 3 ML MISC: Status: AC

## 2015-07-30 MED ORDER — "NEEDLE (DISP) 18G X 1"" MISC": Status: DC

## 2015-07-30 MED ORDER — TESTOSTERONE CYPIONATE 200 MG/ML IM SOLN: 200.0000 mg | INTRAMUSCULAR | Status: DC

## 2015-07-30 MED ORDER — TESTOSTERONE CYPIONATE 200 MG/ML IM SOLN
200.0000 mg | Freq: Once | INTRAMUSCULAR | Status: AC
  Administered 2015-07-30: 200 mg via INTRAMUSCULAR

## 2015-07-30 NOTE — Progress Notes (Signed)
   Subjective:    Patient ID: Jordan Marquez, male    DOB: 11/26/1951, 64 y.o.   MRN: [id number]  HPI  Jordan Marquez is here for a testosterone injection. Denies chest pain, shortness of breath, headaches or mood changes. Patient is ready for in home injections. Patient's family member educated on how to give injection. She voice understanding.   Review of Systems     Objective:   Physical Exam        Assessment & Plan:  Patient tolerated injection well without complications. Patient advised to schedule next injection 14 days from today or inject at home.

## 2015-07-30 NOTE — Progress Notes (Signed)
   Subjective:    Patient ID: Jordan Marquez, male    DOB: 10-23-1951, 64 y.o.   MRN: [id number]  HPI    Review of Systems     Objective:   Physical Exam        Assessment & Plan:  Agree with below.

## 2015-08-14 ENCOUNTER — Ambulatory Visit (INDEPENDENT_AMBULATORY_CARE_PROVIDER_SITE_OTHER): Payer: BLUE CROSS/BLUE SHIELD | Admitting: Family Medicine

## 2015-08-14 ENCOUNTER — Telehealth: Payer: Self-pay | Admitting: Family Medicine

## 2015-08-14 ENCOUNTER — Encounter: Payer: Self-pay | Admitting: Family Medicine

## 2015-08-14 VITALS — BP 117/72 | HR 73 | Temp 97.7°F | Wt 180.0 lb

## 2015-08-14 DIAGNOSIS — E1165 Type 2 diabetes mellitus with hyperglycemia: Secondary | ICD-10-CM | POA: Diagnosis not present

## 2015-08-14 DIAGNOSIS — N281 Cyst of kidney, acquired: Secondary | ICD-10-CM

## 2015-08-14 DIAGNOSIS — IMO0002 Reserved for concepts with insufficient information to code with codable children: Secondary | ICD-10-CM

## 2015-08-14 DIAGNOSIS — Z23 Encounter for immunization: Secondary | ICD-10-CM | POA: Diagnosis not present

## 2015-08-14 LAB — POCT GLYCOSYLATED HEMOGLOBIN (HGB A1C): Hemoglobin A1C: 6.4

## 2015-08-14 MED ORDER — METFORMIN HCL 1000 MG PO TABS: 1000.0000 mg | ORAL_TABLET | Freq: Two times a day (BID) | ORAL | Status: DC

## 2015-08-14 NOTE — Telephone Encounter (Signed)
Please call patient: I was reviewing his chart and noted that he had an abdominal ultrasound back in 2011 with Dr. Cathey Endow. He was noted to have a cyst on one of his kidneys and they had recommended a repeat ultrasound just to make sure that it wasn't getting any larger. I'm going to go ahead and place the order and encourage him to get this time if he is able to.

## 2015-08-14 NOTE — Telephone Encounter (Signed)
Pt informed and is ok with order.Jordan Marquez [place]

## 2015-08-14 NOTE — Progress Notes (Signed)
   Subjective:    Patient ID: Jordan Marquez, male    DOB: 1951/06/25, 64 y.o.   MRN: [id number]  HPI Diabetes - no hypoglycemic events. No wounds or sores that are not healing well. No increased thirst or urination. Checking glucose at home. Taking medications as prescribed without any side effects. Says he was getting some low sugars.  He stopped the Janumet 8 weeks ago bc of the lows and went back to the metformin. Not on insulin. He has lost about 27 lbs.  Says he is on no carb diet.     Review of Systems     Objective:   Physical Exam  Constitutional: He is oriented to person, place, and time. He appears well-developed and well-nourished.  HENT:  Head: Normocephalic and atraumatic.  Cardiovascular: Normal rate, regular rhythm and normal heart sounds.   Pulmonary/Chest: Effort normal and breath sounds normal.  Neurological: He is alert and oriented to person, place, and time.  Skin: Skin is warm and dry.  Psychiatric: He has a normal mood and affect. His behavior is normal.        Assessment & Plan:  DM- Doing well pon Metformin and the Jardiance.  Well controlled.  A1C is great at 6.4  F/U in 3 months.  On statin.  Says his eye exam was the first of August.    Renal cyst-and going back through his chart he had an ultrasound done in 2011 which showed a 1 cm complex cyst in the right kidney. Contact patient to do a follow-up ultrasound for further evaluation.  Shingles vaccine given today.

## 2015-10-20 ENCOUNTER — Other Ambulatory Visit: Payer: Self-pay | Admitting: Family Medicine

## 2015-11-26 ENCOUNTER — Other Ambulatory Visit: Payer: Self-pay | Admitting: Family Medicine

## 2015-11-27 ENCOUNTER — Other Ambulatory Visit: Payer: Self-pay | Admitting: Family Medicine

## 2015-12-18 ENCOUNTER — Ambulatory Visit: Payer: BLUE CROSS/BLUE SHIELD | Admitting: Family Medicine

## 2015-12-27 ENCOUNTER — Ambulatory Visit (INDEPENDENT_AMBULATORY_CARE_PROVIDER_SITE_OTHER): Payer: BLUE CROSS/BLUE SHIELD | Admitting: Family Medicine

## 2015-12-27 ENCOUNTER — Telehealth: Payer: Self-pay | Admitting: Family Medicine

## 2015-12-27 ENCOUNTER — Encounter: Payer: Self-pay | Admitting: Family Medicine

## 2015-12-27 VITALS — BP 111/60 | HR 62 | Ht 67.0 in | Wt 185.0 lb

## 2015-12-27 DIAGNOSIS — E291 Testicular hypofunction: Secondary | ICD-10-CM | POA: Diagnosis not present

## 2015-12-27 DIAGNOSIS — IMO0002 Reserved for concepts with insufficient information to code with codable children: Secondary | ICD-10-CM

## 2015-12-27 DIAGNOSIS — E1165 Type 2 diabetes mellitus with hyperglycemia: Secondary | ICD-10-CM

## 2015-12-27 DIAGNOSIS — E118 Type 2 diabetes mellitus with unspecified complications: Secondary | ICD-10-CM | POA: Diagnosis not present

## 2015-12-27 LAB — POCT GLYCOSYLATED HEMOGLOBIN (HGB A1C): HEMOGLOBIN A1C: 6.7

## 2015-12-27 MED ORDER — SITAGLIPTIN PHOS-METFORMIN HCL 50-1000 MG PO TABS: 1.0000 | ORAL_TABLET | Freq: Two times a day (BID) | ORAL | Status: DC

## 2015-12-27 NOTE — Telephone Encounter (Signed)
Call patient: Look back in his records and he did have a cyst on his kidney back in 2011. Dr. Cathey Endow had ordered an ultrasound. It doesn't look like the skin was never repeated but that was the recommendation by the radiologist. If he is okay with scheduling a renal ultrasound for follow-up of the system please let me know and we can place the order.

## 2015-12-27 NOTE — Progress Notes (Signed)
   Subjective:    Patient ID: Jordan Marquez, male    DOB: [birth year], 65 y.o.   MRN: [id number]  HPI Diabetes - no hypoglycemic events. No wounds or sores that are not healing well. No increased thirst or urination. Checking glucose at home. Taking medications as prescribed without any side effects. Taking metformin by itself.   Hypogonadism - wants 90 day supply. He is doing well with the medication. His daughter still does the shop for him. One mL every 2 weeks. The last time he had a PSA and testosterone level was about a year ago so he is actually overdue.  Hyperlipidemia-tolerated simvastatin well without any side effects. No myalgias.  Review of Systems     Objective:   Physical Exam  Constitutional: He is oriented to person, place, and time. He appears well-developed and well-nourished.  HENT:  Head: Normocephalic and atraumatic.  Cardiovascular: Normal rate, regular rhythm and normal heart sounds.   Pulmonary/Chest: Effort normal and breath sounds normal.  Neurological: He is alert and oriented to person, place, and time.  Skin: Skin is warm and dry.  Psychiatric: He has a normal mood and affect. His behavior is normal.       Assessment & Plan:  DM - well controlled though his A1c is up from previous of 6.4 baby also was only taking metformin and that of Janumet. Recommend change back to Janumet. We'll switch to the 50/1000. 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 In addition to working on weight loss. I recommended that he work on setting a goal to lose about 20 pounds in the make a huge difference in controlling his diabetes. Due for CMP and lipid panel. Lab Results  Component Value Date   HGBA1C 6.7 12/27/2015     Hypogonadism - we have been providing 90 day supplies for his testosterone. In fact he was given a 10 mL vial in December which should theoretically last 5 months. Some not sure why he is running out. He will call the  pharmacy to confirm what he was given and let me know. He is also due to have testosterone, PSA and liver function tested.  Hyperlipidemia-continue current regimen. Due for CMP and lipid panel.

## 2015-12-28 LAB — COMPLETE METABOLIC PANEL WITH GFR
ALT: 19 U/L (ref 9–46)
AST: 24 U/L (ref 10–35)
Albumin: 4.4 g/dL (ref 3.6–5.1)
Alkaline Phosphatase: 40 U/L (ref 40–115)
BILIRUBIN TOTAL: 2.5 mg/dL — AB (ref 0.2–1.2)
BUN: 23 mg/dL (ref 7–25)
CHLORIDE: 102 mmol/L (ref 98–110)
CO2: 26 mmol/L (ref 20–31)
Calcium: 9.2 mg/dL (ref 8.6–10.3)
Creat: 0.87 mg/dL (ref 0.70–1.25)
GLUCOSE: 88 mg/dL (ref 65–99)
POTASSIUM: 4.3 mmol/L (ref 3.5–5.3)
SODIUM: 137 mmol/L (ref 135–146)
TOTAL PROTEIN: 6.7 g/dL (ref 6.1–8.1)

## 2015-12-28 LAB — PSA: PSA: 1.03 ng/mL (ref <=4.00)

## 2015-12-28 LAB — TESTOSTERONE, FREE, TOTAL, SHBG
SEX HORMONE BINDING: 48 nmol/L (ref 22–77)
TESTOSTERONE FREE: 281.1 pg/mL — AB (ref 47.0–244.0)
TESTOSTERONE-% FREE: 2.1 % (ref 1.6–2.9)
TESTOSTERONE: 1343 ng/dL — AB (ref 250–827)

## 2015-12-28 LAB — LIPID PANEL
CHOL/HDL RATIO: 2.7 ratio (ref <=5.0)
Cholesterol: 139 mg/dL (ref 125–200)
HDL: 51 mg/dL (ref >=40)
LDL CALC: 75 mg/dL (ref <130)
TRIGLYCERIDES: 64 mg/dL (ref <150)
VLDL: 13 mg/dL (ref <30)

## 2015-12-28 NOTE — Telephone Encounter (Signed)
Left VM for Pt regarding repeat imaging. Callback information provided to let us know.

## 2015-12-31 LAB — TESTOS,TOTAL,FREE AND SHBG (FEMALE)
SEX HORMONE BINDING GLOB.: 42 nmol/L (ref 22–77)
Testosterone, Free: 390.5 pg/mL — ABNORMAL HIGH (ref 35.0–155.0)
Testosterone,Total,LC/MS/MS: 1972 ng/dL — ABNORMAL HIGH (ref 250–1100)

## 2016-01-02 NOTE — Telephone Encounter (Signed)
Let's mail him a letter reminding him about the need for imaging.

## 2016-01-03 ENCOUNTER — Encounter: Payer: Self-pay | Admitting: Family Medicine

## 2016-01-03 NOTE — Telephone Encounter (Signed)
Attempted to contact Pt, left another VM. Created a letter and it will be mailed out today.

## 2016-01-05 ENCOUNTER — Other Ambulatory Visit: Payer: Self-pay | Admitting: Family Medicine

## 2016-01-10 ENCOUNTER — Other Ambulatory Visit: Payer: Self-pay | Admitting: Family Medicine

## 2016-01-10 DIAGNOSIS — E291 Testicular hypofunction: Secondary | ICD-10-CM

## 2016-01-14 ENCOUNTER — Ambulatory Visit (INDEPENDENT_AMBULATORY_CARE_PROVIDER_SITE_OTHER): Payer: BLUE CROSS/BLUE SHIELD

## 2016-01-14 DIAGNOSIS — N281 Cyst of kidney, acquired: Secondary | ICD-10-CM | POA: Diagnosis not present

## 2016-01-15 ENCOUNTER — Encounter: Payer: Self-pay | Admitting: Family Medicine

## 2016-01-15 DIAGNOSIS — N400 Enlarged prostate without lower urinary tract symptoms: Secondary | ICD-10-CM | POA: Insufficient documentation

## 2016-01-22 LAB — TESTOSTERONE, FREE, TOTAL, SHBG
SEX HORMONE BINDING: 36 nmol/L (ref 22–77)
TESTOSTERONE FREE: 80.6 pg/mL (ref 47.0–244.0)
Testosterone-% Free: 1.9 % (ref 1.6–2.9)
Testosterone: 416 ng/dL (ref 250–827)

## 2016-03-25 ENCOUNTER — Other Ambulatory Visit: Payer: Self-pay | Admitting: Family Medicine

## 2016-04-24 ENCOUNTER — Encounter: Payer: Self-pay | Admitting: Family Medicine

## 2016-04-24 ENCOUNTER — Ambulatory Visit (INDEPENDENT_AMBULATORY_CARE_PROVIDER_SITE_OTHER): Payer: BLUE CROSS/BLUE SHIELD | Admitting: Family Medicine

## 2016-04-24 VITALS — BP 110/66 | HR 58 | Wt 190.0 lb

## 2016-04-24 DIAGNOSIS — E291 Testicular hypofunction: Secondary | ICD-10-CM

## 2016-04-24 DIAGNOSIS — IMO0002 Reserved for concepts with insufficient information to code with codable children: Secondary | ICD-10-CM

## 2016-04-24 DIAGNOSIS — E7800 Pure hypercholesterolemia, unspecified: Secondary | ICD-10-CM

## 2016-04-24 DIAGNOSIS — E1165 Type 2 diabetes mellitus with hyperglycemia: Secondary | ICD-10-CM | POA: Diagnosis not present

## 2016-04-24 DIAGNOSIS — E118 Type 2 diabetes mellitus with unspecified complications: Secondary | ICD-10-CM | POA: Diagnosis not present

## 2016-04-24 LAB — POCT UA - MICROALBUMIN
CREATININE, POC: 200 mg/dL
Microalbumin Ur, POC: 30 mg/L

## 2016-04-24 LAB — POCT GLYCOSYLATED HEMOGLOBIN (HGB A1C): Hemoglobin A1C: 6.3

## 2016-04-24 LAB — GLUCOSE, POCT (MANUAL RESULT ENTRY): POC GLUCOSE: 107 mg/dL — AB (ref 70–99)
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
## 2016-04-25 ENCOUNTER — Other Ambulatory Visit: Payer: Self-pay | Admitting: Family Medicine

## 2016-07-01 ENCOUNTER — Other Ambulatory Visit: Payer: Self-pay | Admitting: Family Medicine

## 2016-07-02 ENCOUNTER — Other Ambulatory Visit: Payer: Self-pay | Admitting: *Deleted

## 2016-07-02 NOTE — Telephone Encounter (Signed)
MEDICATION HAS ALREADY BEEN SENT TO PHARMACY.Loralee Pacas [place]

## 2016-07-14 ENCOUNTER — Other Ambulatory Visit: Payer: Self-pay | Admitting: Family Medicine

## 2016-07-14 DIAGNOSIS — E291 Testicular hypofunction: Secondary | ICD-10-CM

## 2016-07-14 NOTE — Telephone Encounter (Signed)
Pt informed he will need to get his testosterone rechecked. Per PCP, he will need to have this drawn one week after injection. Pt verbalized understanding.

## 2016-07-19 IMAGING — US US RENAL
1 series · 14 of 25 positions shown · non-contrast
Comparison: [DATE]

CLINICAL DATA: Followup RIGHT renal cyst from 09/25/2010

EXAM:
RENAL / URINARY TRACT ULTRASOUND COMPLETE
INDICATION: Followup RIGHT renal cyst

[Series 1: us renal · 0.22mm/px · 14 of 37 slices shown]
[im 1/37]
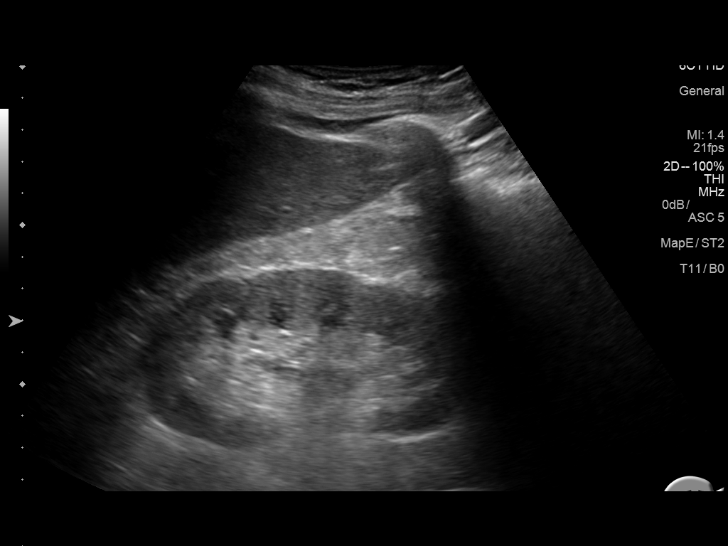
[im 4/37]
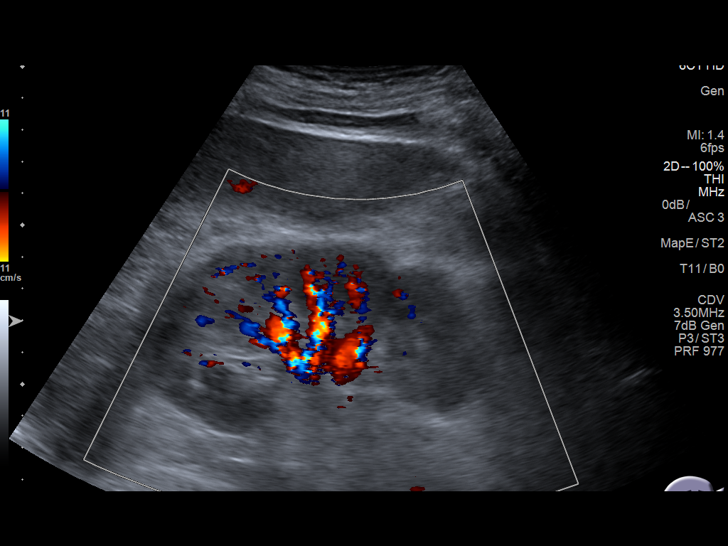
[im 7/37]
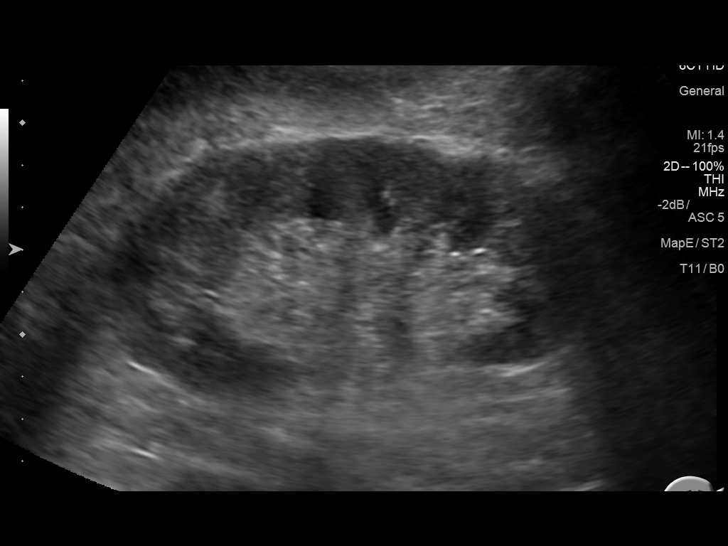
[im 10/37]
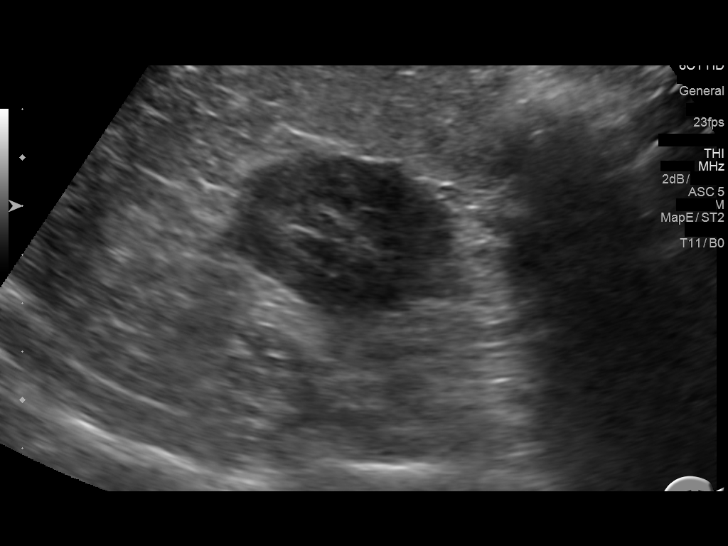
[im 13/37]
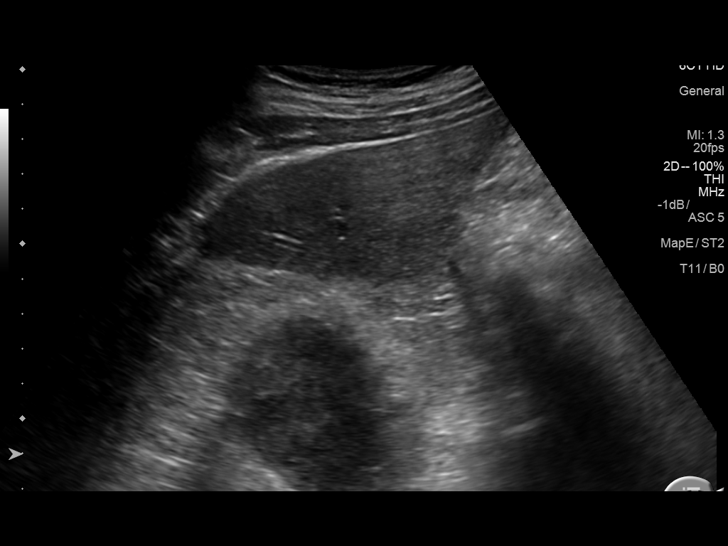
[im 14/37]
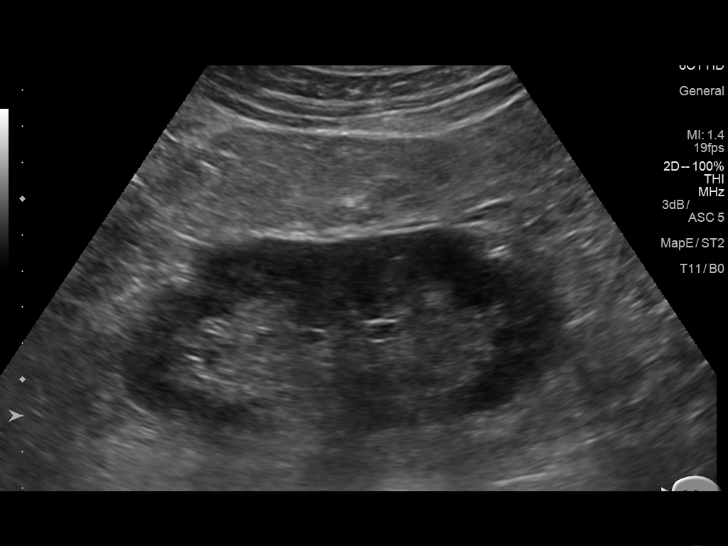
[im 17/37]
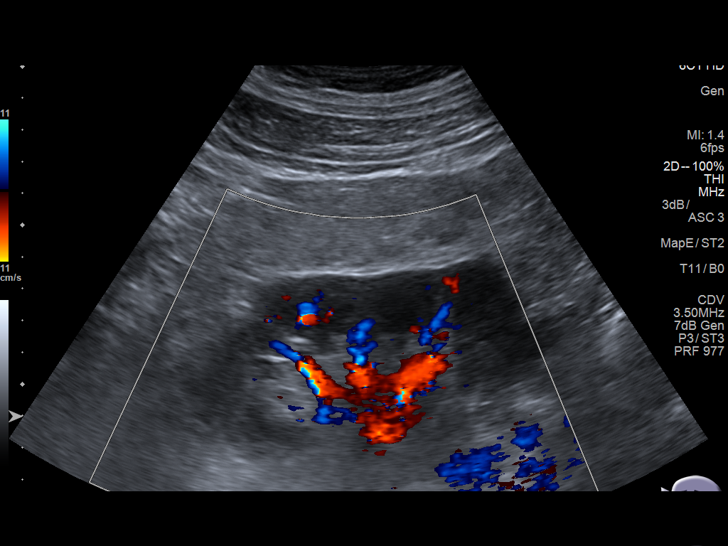
[im 20/37]
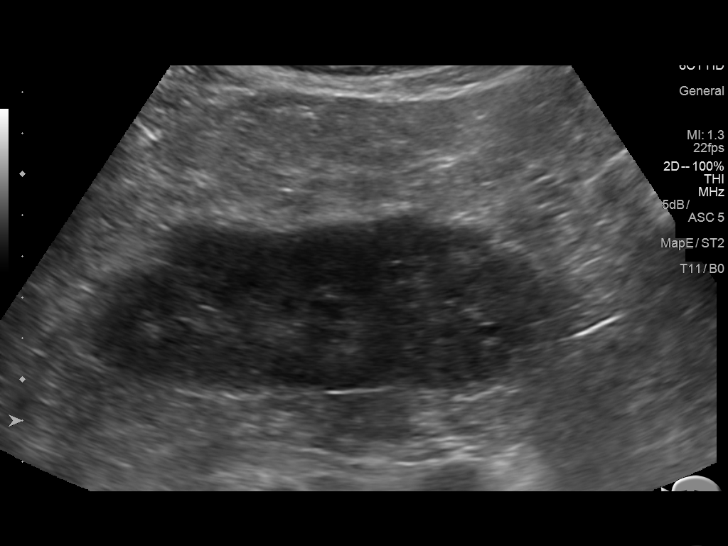
[im 23/37]
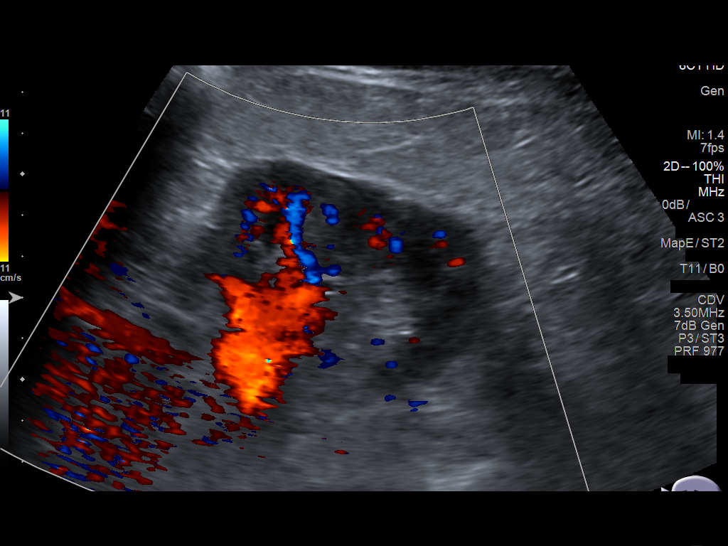
[im 25/37]
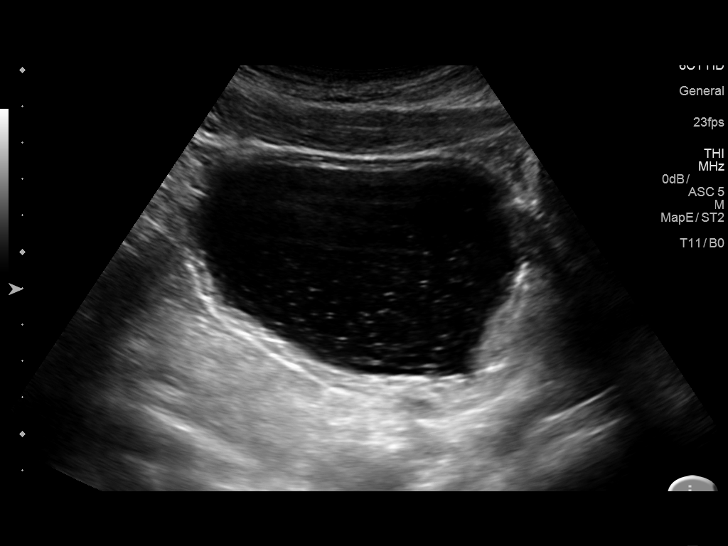
[im 28/37]
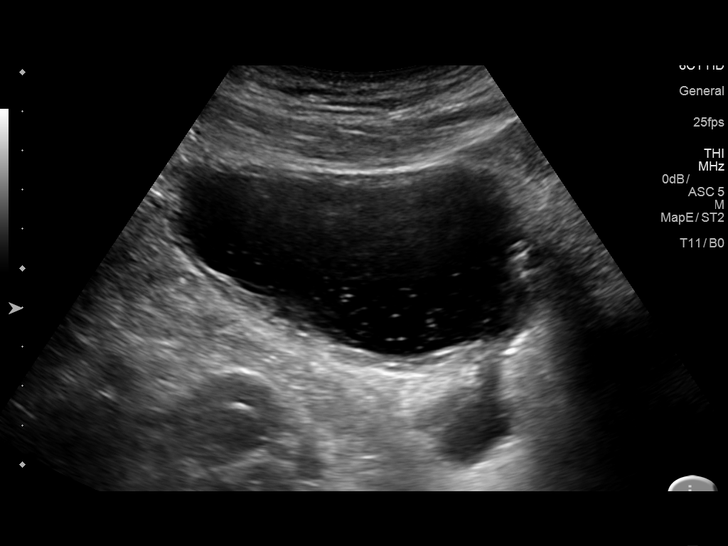
[im 31/37]
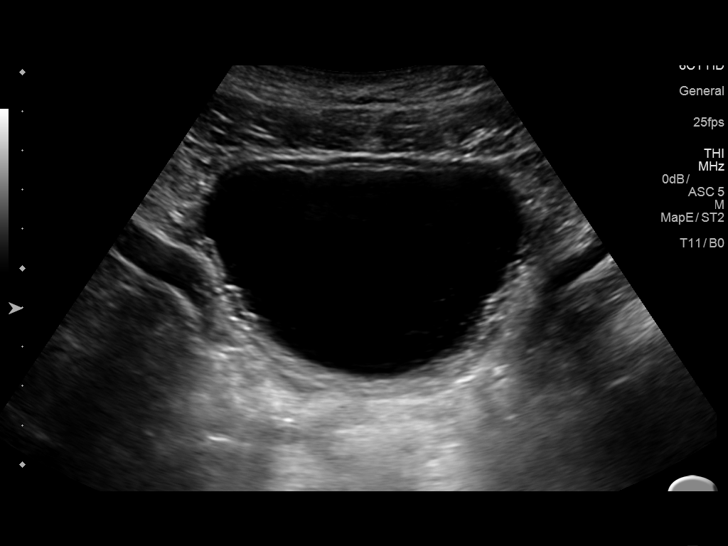
[im 34/37]
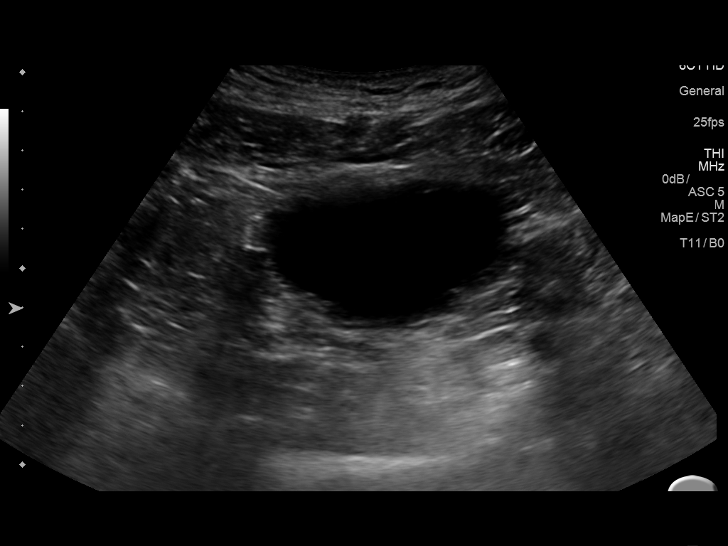
[im 37/37]
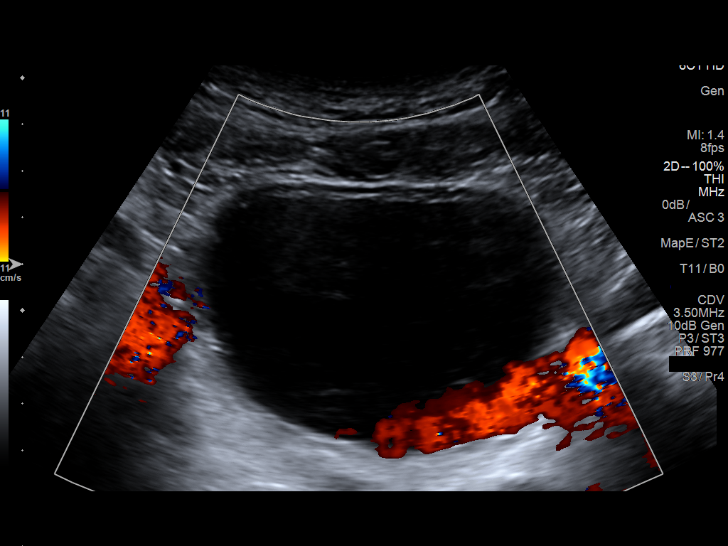

[14 of 25 positions shown; findings below may reference images not displayed]

FINDINGS: Right Kidney:

Length: 11.5 cm. Normal cortical thickness. Upper normal cortical
echogenicity. No mass, hydronephrosis or shadowing calcification.

Left Kidney:

Length: 12.1 cm. Normal cortical thickness and echogenicity. Though
mass, hydronephrosis or shadowing calcification.

Bladder:

Debris within urinary bladder. Question mild bladder trabeculation.
Prostate gland appears enlarged though incompletely visualized; a
single measurement measures approximately 5 mm 5 cm. No focal
bladder mass.
IMPRESSION: No evidence of renal mass or hydronephrosis.

Mild prostatic enlargement with question mild bladder wall
trabeculation suggesting component of chronic outlet obstruction.

## 2016-07-25 ENCOUNTER — Other Ambulatory Visit: Payer: Self-pay | Admitting: *Deleted

## 2016-07-25 MED ORDER — SITAGLIPTIN PHOS-METFORMIN HCL 50-1000 MG PO TABS: 1.0000 | ORAL_TABLET | Freq: Two times a day (BID) | ORAL | 3 refills | Status: DC

## 2016-07-29 ENCOUNTER — Other Ambulatory Visit: Payer: Self-pay | Admitting: Family Medicine

## 2016-07-29 DIAGNOSIS — E118 Type 2 diabetes mellitus with unspecified complications: Secondary | ICD-10-CM

## 2016-07-29 DIAGNOSIS — E1165 Type 2 diabetes mellitus with hyperglycemia: Secondary | ICD-10-CM

## 2016-07-29 DIAGNOSIS — E291 Testicular hypofunction: Secondary | ICD-10-CM

## 2016-07-29 DIAGNOSIS — IMO0002 Reserved for concepts with insufficient information to code with codable children: Secondary | ICD-10-CM

## 2016-07-29 NOTE — Progress Notes (Signed)
Labs re-entered

## 2016-08-01 LAB — BASIC METABOLIC PANEL WITH GFR
BUN: 27 mg/dL — ABNORMAL HIGH (ref 7–25)
CALCIUM: 9.2 mg/dL (ref 8.6–10.3)
CO2: 22 mmol/L (ref 20–31)
CREATININE: 0.96 mg/dL (ref 0.70–1.25)
Chloride: 100 mmol/L (ref 98–110)
GFR, Est African American: >89 mL/min (ref >=60)
GFR, Est Non African American: 82 mL/min (ref >=60)
GLUCOSE: 131 mg/dL — AB (ref 65–99)
Potassium: 4.2 mmol/L (ref 3.5–5.3)
SODIUM: 136 mmol/L (ref 135–146)

## 2016-08-01 LAB — TESTOSTERONE: Testosterone: 699 ng/dL (ref 250–827)

## 2016-08-28 ENCOUNTER — Ambulatory Visit: Payer: BLUE CROSS/BLUE SHIELD | Admitting: Family Medicine

## 2016-09-11 ENCOUNTER — Telehealth: Payer: Self-pay | Admitting: Family Medicine

## 2016-09-11 DIAGNOSIS — E291 Testicular hypofunction: Secondary | ICD-10-CM

## 2016-09-11 NOTE — Telephone Encounter (Signed)
Call pt:  Needs a CBC for his testosterone.

## 2016-09-12 NOTE — Telephone Encounter (Signed)
lvm informing pt that he will need to have labs done for his testosterone and that I have faxed them .Loralee Pacas[place], [place] Carlino [place]

## 2016-09-22 ENCOUNTER — Other Ambulatory Visit: Payer: Self-pay | Admitting: Family Medicine

## 2016-10-03 ENCOUNTER — Other Ambulatory Visit: Payer: Self-pay | Admitting: Family Medicine

## 2016-10-16 ENCOUNTER — Encounter: Payer: Self-pay | Admitting: Family Medicine

## 2016-10-16 ENCOUNTER — Telehealth: Payer: Self-pay | Admitting: Family Medicine

## 2016-10-16 ENCOUNTER — Ambulatory Visit (INDEPENDENT_AMBULATORY_CARE_PROVIDER_SITE_OTHER): Payer: BLUE CROSS/BLUE SHIELD | Admitting: Family Medicine

## 2016-10-16 VITALS — BP 115/72 | HR 70 | Ht 67.0 in | Wt 202.0 lb

## 2016-10-16 DIAGNOSIS — E291 Testicular hypofunction: Secondary | ICD-10-CM | POA: Diagnosis not present

## 2016-10-16 DIAGNOSIS — E118 Type 2 diabetes mellitus with unspecified complications: Secondary | ICD-10-CM

## 2016-10-16 DIAGNOSIS — E1165 Type 2 diabetes mellitus with hyperglycemia: Secondary | ICD-10-CM

## 2016-10-16 DIAGNOSIS — B359 Dermatophytosis, unspecified: Secondary | ICD-10-CM

## 2016-10-16 DIAGNOSIS — R21 Rash and other nonspecific skin eruption: Secondary | ICD-10-CM

## 2016-10-16 DIAGNOSIS — IMO0002 Reserved for concepts with insufficient information to code with codable children: Secondary | ICD-10-CM

## 2016-10-16 LAB — POCT GLYCOSYLATED HEMOGLOBIN (HGB A1C): HEMOGLOBIN A1C: 7

## 2016-10-16 NOTE — Progress Notes (Signed)
Subjective:    CC: DM  HPI:  Diabetes - no hypoglycemic events. No wounds or sores that are not healing well. No increased thirst or urination. Checking glucose at home. Taking medications as prescribed without any side effects.He does golf and get some exercise at least every other day. He does have an eye exam scheduled for November 30.  Admits hasn't been eating the best.   The has a rash on his left lower leg has been there for several months. It's been slowly spreading. It was extremely itchy.   He's been using some diabetic cream on it that his wife got from the drugstore. Does help with the itching.  Hypogonadism - needs refill. Does injection every other day. He says the pharmacy did not fill the full prescription last time. He is not sure why.  Past medical history, Surgical history, Family history not pertinant except as noted below, Social history, Allergies, and medications have been entered into the medical record, reviewed, and corrections made.   Review of Systems: No fevers, chills, night sweats, weight loss, chest pain, or shortness of breath.   Objective:    General: Well Developed, well nourished, and in no acute distress.  Neuro: Alert and oriented x3, extra-ocular muscles intact, sensation grossly intact.  HEENT: Normocephalic, atraumatic  Skin: Warm and dry. He has a larger Eczematous scaling rash with a circular border and central clearing on the left lower leg above his ankle. Cardiac: Regular rate and rhythm, no murmurs rubs or gallops, no lower extremity edema.  Respiratory: Clear to auscultation bilaterally. Not using accessory muscles, speaking in full sentences.   Impression and Recommendations:    DM -   A1c up-to-date 7.0. Previously was great at 6.3. Make sure working on diet and exercise continue current regimen. Follow-up in 3 months.  Ring worm- we'll treat with topical Lamisil x 4 weeks. Skin scraping sent.   Hypogonadism-less testosterone check was  in August. PSA was in January and we will need to repeat at that time. He is doing well with his regimen. But we do need to call the pharmacy and figure out why the medication as not being filled appropriately.

## 2016-10-16 NOTE — Patient Instructions (Signed)
Get a tube of over-the-counter Lamisil. Apply twice a day to the rash on her leg. Will need to apply it for at least a month. May take a couple weeks to see any improvement.

## 2016-10-16 NOTE — Telephone Encounter (Signed)
Please call the pharmacy. Per patient seems like they may have just given him a partial fill on his testosterone. He is having problems feeling it. I don't know if it needs to be rewritten. It sounds like they are not going to fill a 10 mL vial. Not sure. Please call and verify what's going on.

## 2016-10-17 NOTE — Telephone Encounter (Signed)
Patient has picked up the testosterone. His insurance will only allow a 30 day fill at a time.

## 2016-10-20 LAB — FUNGAL STAIN

## 2016-11-06 LAB — HM DIABETES EYE EXAM

## 2016-12-20 ENCOUNTER — Other Ambulatory Visit: Payer: Self-pay | Admitting: Family Medicine

## 2016-12-24 ENCOUNTER — Encounter: Payer: Self-pay | Admitting: Family Medicine

## 2017-01-21 ENCOUNTER — Telehealth: Payer: Self-pay | Admitting: Family Medicine

## 2017-01-21 NOTE — Telephone Encounter (Signed)
Call patient: He needs to go for the blood count. We have actually ordered back in October. We have to test this at least every 12 months while he is on testosterone replacement therapy. He needs to go as soon as possible.  Nani Gasseratherine My Rinke, MD

## 2017-01-21 NOTE — Telephone Encounter (Signed)
Left recommendation on Pt's VM, callback information provided.  

## 2017-01-30 ENCOUNTER — Encounter: Payer: Self-pay | Admitting: Family Medicine

## 2017-01-30 NOTE — Telephone Encounter (Signed)
Letter mailed

## 2017-01-30 NOTE — Telephone Encounter (Signed)
Please maile him a letterl

## 2017-02-13 ENCOUNTER — Ambulatory Visit: Payer: BLUE CROSS/BLUE SHIELD | Admitting: Family Medicine

## 2018-03-23 ENCOUNTER — Other Ambulatory Visit: Payer: Self-pay | Admitting: Family Medicine

## 2018-03-23 DIAGNOSIS — E1165 Type 2 diabetes mellitus with hyperglycemia: Secondary | ICD-10-CM

## 2019-12-15 ENCOUNTER — Ambulatory Visit: Payer: BLUE CROSS/BLUE SHIELD | Attending: Internal Medicine

## 2019-12-15 DIAGNOSIS — Z20822 Contact with and (suspected) exposure to covid-19: Secondary | ICD-10-CM

## 2019-12-17 LAB — NOVEL CORONAVIRUS, NAA: SARS-CoV-2, NAA: DETECTED — AB

## 2021-10-13 ENCOUNTER — Other Ambulatory Visit: Payer: Self-pay

## 2021-10-13 ENCOUNTER — Emergency Department (INDEPENDENT_AMBULATORY_CARE_PROVIDER_SITE_OTHER)
Admission: EM | Admit: 2021-10-13 | Discharge: 2021-10-13 | Disposition: A | Discharge status: Home / Self Care | Payer: Medicare Other | Source: Home / Self Care | Attending: Family Medicine | Admitting: Family Medicine

## 2021-10-13 DIAGNOSIS — H9122 Sudden idiopathic hearing loss, left ear: Secondary | ICD-10-CM

## 2021-10-13 DIAGNOSIS — R42 Dizziness and giddiness: Secondary | ICD-10-CM

## 2021-10-13 MED ORDER — MECLIZINE HCL 12.5 MG PO TABS: 12.5000 mg | ORAL_TABLET | Freq: Three times a day (TID) | ORAL | 0 refills | Status: AC | PRN

## 2021-10-13 MED ORDER — FLUTICASONE PROPIONATE 50 MCG/ACT NA SUSP: 2.0000 | Freq: Every day | NASAL | 0 refills | Status: AC

## 2021-10-13 NOTE — ED Triage Notes (Signed)
Pt states that he has some fullness of his left ear and dizziness. X2 days

## 2021-10-13 NOTE — Discharge Instructions (Signed)
Use Flonase to help take down swelling of your nasal passages and allow your ear to drain Drink lots of fluids Take the meclizine up to 3 times a day for drowsiness Do not drive while you are dizzy.  I recommend you go straight home, or call someone to pick you up.  You need to send someone to pick up your medicines for you Call your doctor tomorrow morning and let them know you have hearing loss and dizziness.  They may want to arrange an ear specialist
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
